# Patient Record
Sex: Female | Born: 1970 | Race: White | Hispanic: No | Marital: Married | Smoking: Former smoker
Health system: Southern US, Community
[De-identification: ages and names within clinical notes are randomized; demographics above are authoritative.]

## PROBLEM LIST (undated history)

## (undated) DIAGNOSIS — D649 Anemia, unspecified: Secondary | ICD-10-CM

## (undated) DIAGNOSIS — Z1371 Encounter for nonprocreative screening for genetic disease carrier status: Secondary | ICD-10-CM

## (undated) DIAGNOSIS — F419 Anxiety disorder, unspecified: Secondary | ICD-10-CM

## (undated) DIAGNOSIS — G35 Multiple sclerosis: Secondary | ICD-10-CM

## (undated) DIAGNOSIS — C50919 Malignant neoplasm of unspecified site of unspecified female breast: Secondary | ICD-10-CM

## (undated) DIAGNOSIS — Z923 Personal history of irradiation: Secondary | ICD-10-CM

## (undated) DIAGNOSIS — G35D Multiple sclerosis, unspecified: Secondary | ICD-10-CM

## (undated) HISTORY — DX: Multiple sclerosis: G35

## (undated) HISTORY — DX: Anxiety disorder, unspecified: F41.9

## (undated) HISTORY — DX: Multiple sclerosis, unspecified: G35.D

## (undated) HISTORY — DX: Anemia, unspecified: D64.9

## (undated) HISTORY — DX: Malignant neoplasm of unspecified site of unspecified female breast: C50.919

---

## 2008-03-01 ENCOUNTER — Encounter: Admission: RE | Admit: 2008-03-01 | Discharge: 2008-03-01 | Payer: Self-pay | Admitting: Family Medicine

## 2008-04-08 ENCOUNTER — Other Ambulatory Visit: Admission: RE | Admit: 2008-04-08 | Discharge: 2008-04-08 | Payer: Self-pay | Admitting: Family Medicine

## 2008-07-08 ENCOUNTER — Encounter: Admission: RE | Admit: 2008-07-08 | Discharge: 2008-07-08 | Payer: Self-pay | Admitting: Family Medicine

## 2008-07-26 HISTORY — PX: LAPAROSCOPIC CHOLECYSTECTOMY: SUR755

## 2010-02-15 DIAGNOSIS — Z923 Personal history of irradiation: Secondary | ICD-10-CM

## 2010-02-15 HISTORY — DX: Personal history of irradiation: Z92.3

## 2010-02-23 ENCOUNTER — Encounter
Admission: RE | Admit: 2010-02-23 | Discharge: 2010-02-23 | Payer: Self-pay | Source: Home / Self Care | Attending: Family Medicine | Admitting: Family Medicine

## 2010-03-04 ENCOUNTER — Encounter
Admission: RE | Admit: 2010-03-04 | Discharge: 2010-03-04 | Payer: Self-pay | Source: Home / Self Care | Attending: Family Medicine | Admitting: Family Medicine

## 2010-03-07 ENCOUNTER — Other Ambulatory Visit: Payer: Self-pay | Admitting: Family Medicine

## 2010-03-07 DIAGNOSIS — R921 Mammographic calcification found on diagnostic imaging of breast: Secondary | ICD-10-CM

## 2010-03-24 ENCOUNTER — Ambulatory Visit
Admission: RE | Admit: 2010-03-24 | Discharge: 2010-03-24 | Disposition: A | Discharge status: Home / Self Care | Payer: BC Managed Care – PPO | Source: Ambulatory Visit | Attending: *Deleted | Admitting: *Deleted

## 2010-03-24 ENCOUNTER — Other Ambulatory Visit: Payer: Self-pay | Admitting: Radiology

## 2010-03-24 ENCOUNTER — Other Ambulatory Visit: Payer: Self-pay | Admitting: Family Medicine

## 2010-03-24 DIAGNOSIS — C50919 Malignant neoplasm of unspecified site of unspecified female breast: Secondary | ICD-10-CM | POA: Insufficient documentation

## 2010-03-24 DIAGNOSIS — R921 Mammographic calcification found on diagnostic imaging of breast: Secondary | ICD-10-CM

## 2010-03-24 HISTORY — DX: Malignant neoplasm of unspecified site of unspecified female breast: C50.919

## 2010-04-03 ENCOUNTER — Other Ambulatory Visit: Payer: Self-pay | Admitting: Surgery

## 2010-04-03 DIAGNOSIS — N6310 Unspecified lump in the right breast, unspecified quadrant: Secondary | ICD-10-CM

## 2010-04-03 HISTORY — PX: BREAST BIOPSY: SHX20

## 2010-04-29 ENCOUNTER — Ambulatory Visit (HOSPITAL_BASED_OUTPATIENT_CLINIC_OR_DEPARTMENT_OTHER)
Admission: RE | Admit: 2010-04-29 | Discharge: 2010-04-29 | Disposition: A | Discharge status: Home / Self Care | Payer: BC Managed Care – PPO | Source: Ambulatory Visit | Attending: Surgery | Admitting: Surgery

## 2010-04-29 ENCOUNTER — Ambulatory Visit
Admission: RE | Admit: 2010-04-29 | Discharge: 2010-04-29 | Disposition: A | Discharge status: Home / Self Care | Payer: BC Managed Care – PPO | Source: Ambulatory Visit | Attending: Surgery | Admitting: Surgery

## 2010-04-29 ENCOUNTER — Other Ambulatory Visit: Payer: Self-pay | Admitting: Surgery

## 2010-04-29 DIAGNOSIS — Z853 Personal history of malignant neoplasm of breast: Secondary | ICD-10-CM | POA: Insufficient documentation

## 2010-04-29 DIAGNOSIS — R928 Other abnormal and inconclusive findings on diagnostic imaging of breast: Secondary | ICD-10-CM | POA: Insufficient documentation

## 2010-04-29 DIAGNOSIS — N6310 Unspecified lump in the right breast, unspecified quadrant: Secondary | ICD-10-CM

## 2010-04-29 DIAGNOSIS — D0590 Unspecified type of carcinoma in situ of unspecified breast: Secondary | ICD-10-CM | POA: Insufficient documentation

## 2010-04-29 HISTORY — PX: BREAST LUMPECTOMY: SHX2

## 2010-05-01 NOTE — Op Note (Signed)
  Traci Watkins, Traci Watkins               ACCOUNT NO.:  [id number]  MEDICAL RECORD NO.:  [id number]           PATIENT TYPE:  LOCATION:                                 FACILITY:  PHYSICIAN:  Currie Paris, M.D.   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  OPERATION:  Needle-guided excision of area of atypical hyperplasia.  SURGEON:  Currie Paris, MD  ANESTHESIA:  General.  CLINICAL HISTORY:  This is a 40 year old lady who has had a previous core biopsy done showing some atypical hyperplasia and a wider excision recommended.  DESCRIPTION OF PROCEDURE:  I saw the patient in the holding area and she had no further questions.  We all initialed the right breast as the operative side.  The patient was taken to the operating room, and after satisfactory general anesthesia the right breast was prepped and draped.  The time- out was done.  I had previously reviewed the films and the guidewire entered laterally, tracked somewhat superiorly, medially, and was in the lower outer quadrant of the right breast.  I made a curvilinear incision about a centimeter medial to the guidewire entry site since that appeared to be where the lesion was going to be and then elevated a little skin flap and manipulated the wire into the wound.  I then freed up the breast tissue circumferentially around the guidewire and grasped the tissue with an Allis clamp and took an excisional biopsy of the tissue around the guidewire taking basically a cylinder tissue out to beyond the tip of the guidewire.  There was what looked like fibrocystic changes in the tissue and some blue dome cysts.  Specimen  mammogram was done and the calcifications appeared to be in the specimen.  The clip was not to be seen.  I went back, irrigated, made sure everything was dry, and carefully palpated my incision and there was a bit of a density in the subcutaneous tissue just under the previous core biopsy skin entry scar and I took that out and included that in a second specimen mammogram and that had contained a clip indicating to me that the clip had migrated out the tract at some point from the time it was placed until today.  Nevertheless, in discussing this with the radiologist, Dr. Rolla Plate, it appeared we had the abnormal calcifications in the main piece of the specimen.  At this point, I made sure everything was dry.  I closed with several layers of 3-0 Vicryl, 4-0 Monocryl subcuticular, and some Dermabond.  The patient tolerated the procedure well, and there were no complications.  Counts were correct.     Currie Paris, M.D.     CJS/MEDQ  D:  04/29/2010  T:  04/30/2010  Job:  [id number]  cc:   Ancil Boozer, MD  Electronically Signed by Cyndia Bent M.D. on 05/01/2010 07:25:20 AM

## 2010-05-08 ENCOUNTER — Other Ambulatory Visit: Payer: Self-pay | Admitting: Surgery

## 2010-05-08 ENCOUNTER — Encounter: Payer: BC Managed Care – PPO | Admitting: Genetic Counselor

## 2010-05-08 DIAGNOSIS — C50911 Malignant neoplasm of unspecified site of right female breast: Secondary | ICD-10-CM

## 2010-05-12 ENCOUNTER — Ambulatory Visit
Admission: RE | Admit: 2010-05-12 | Discharge: 2010-05-12 | Disposition: A | Discharge status: Home / Self Care | Payer: BC Managed Care – PPO | Source: Ambulatory Visit | Attending: Surgery | Admitting: Surgery

## 2010-05-12 DIAGNOSIS — C50911 Malignant neoplasm of unspecified site of right female breast: Secondary | ICD-10-CM

## 2010-05-12 MED ORDER — GADOBENATE DIMEGLUMINE 529 MG/ML IV SOLN
15.0000 mL | Freq: Once | INTRAVENOUS | Status: AC | PRN
  Administered 2010-05-12: 15 mL via INTRAVENOUS

## 2010-05-18 ENCOUNTER — Ambulatory Visit: Payer: BC Managed Care – PPO | Attending: Radiation Oncology | Admitting: Radiation Oncology

## 2010-05-18 DIAGNOSIS — R002 Palpitations: Secondary | ICD-10-CM | POA: Insufficient documentation

## 2010-05-18 DIAGNOSIS — L539 Erythematous condition, unspecified: Secondary | ICD-10-CM | POA: Insufficient documentation

## 2010-05-18 DIAGNOSIS — Z510 Encounter for antineoplastic radiation therapy: Secondary | ICD-10-CM | POA: Insufficient documentation

## 2010-05-18 DIAGNOSIS — C50419 Malignant neoplasm of upper-outer quadrant of unspecified female breast: Secondary | ICD-10-CM | POA: Insufficient documentation

## 2010-05-21 ENCOUNTER — Other Ambulatory Visit: Payer: Self-pay | Admitting: Surgery

## 2010-05-21 DIAGNOSIS — Z9889 Other specified postprocedural states: Secondary | ICD-10-CM

## 2010-05-26 ENCOUNTER — Ambulatory Visit
Admission: RE | Admit: 2010-05-26 | Discharge: 2010-05-26 | Disposition: A | Discharge status: Home / Self Care | Payer: BC Managed Care – PPO | Source: Ambulatory Visit | Attending: Surgery | Admitting: Surgery

## 2010-05-26 DIAGNOSIS — Z9889 Other specified postprocedural states: Secondary | ICD-10-CM

## 2010-06-08 ENCOUNTER — Other Ambulatory Visit: Payer: Self-pay | Admitting: Family Medicine

## 2010-06-08 DIAGNOSIS — R921 Mammographic calcification found on diagnostic imaging of breast: Secondary | ICD-10-CM

## 2010-07-09 ENCOUNTER — Other Ambulatory Visit: Payer: Self-pay | Admitting: Radiation Oncology

## 2010-07-09 LAB — CBC WITH DIFFERENTIAL/PLATELET
Basophils Absolute: 0 10*3/uL (ref 0.0–0.1)
EOS%: 0.7 % (ref 0.0–7.0)
Eosinophils Absolute: 0 10*3/uL (ref 0.0–0.5)
HCT: 40.5 % (ref 34.8–46.6)
HGB: 14.2 g/dL (ref 11.6–15.9)
MCH: 30.2 pg (ref 25.1–34.0)
MCV: 86.6 fL (ref 79.5–101.0)
MONO%: 12.6 % (ref 0.0–14.0)
NEUT#: 3.7 10*3/uL (ref 1.5–6.5)
NEUT%: 68.7 % (ref 38.4–76.8)
lymph#: 0.9 10*3/uL (ref 0.9–3.3)

## 2010-07-09 LAB — BASIC METABOLIC PANEL
BUN: 9 mg/dL (ref 6–23)
Chloride: 100 mEq/L (ref 96–112)
Creatinine, Ser: 0.68 mg/dL (ref 0.40–1.20)
Glucose, Bld: 100 mg/dL — ABNORMAL HIGH (ref 70–99)

## 2010-08-10 ENCOUNTER — Other Ambulatory Visit: Payer: Self-pay | Admitting: Oncology

## 2010-08-10 ENCOUNTER — Encounter (HOSPITAL_BASED_OUTPATIENT_CLINIC_OR_DEPARTMENT_OTHER): Payer: BC Managed Care – PPO | Admitting: Oncology

## 2010-08-10 DIAGNOSIS — C50419 Malignant neoplasm of upper-outer quadrant of unspecified female breast: Secondary | ICD-10-CM

## 2010-08-10 LAB — CBC WITH DIFFERENTIAL/PLATELET
BASO%: 0.5 % (ref 0.0–2.0)
EOS%: 0.4 % (ref 0.0–7.0)
Eosinophils Absolute: 0 10*3/uL (ref 0.0–0.5)
LYMPH%: 18 % (ref 14.0–49.7)
MCHC: 34.5 g/dL (ref 31.5–36.0)
MCV: 87.1 fL (ref 79.5–101.0)
MONO%: 11.6 % (ref 0.0–14.0)
NEUT#: 4.3 10*3/uL (ref 1.5–6.5)
Platelets: 289 10*3/uL (ref 145–400)
RBC: 4.5 10*6/uL (ref 3.70–5.45)
RDW: 12.6 % (ref 11.2–14.5)

## 2010-08-10 LAB — COMPREHENSIVE METABOLIC PANEL
ALT: 28 U/L (ref 0–35)
AST: 21 U/L (ref 0–37)
Albumin: 3.9 g/dL (ref 3.5–5.2)
Alkaline Phosphatase: 33 U/L — ABNORMAL LOW (ref 39–117)
Glucose, Bld: 100 mg/dL — ABNORMAL HIGH (ref 70–99)
Potassium: 4 mEq/L (ref 3.5–5.3)
Sodium: 136 mEq/L (ref 135–145)
Total Bilirubin: 0.4 mg/dL (ref 0.3–1.2)
Total Protein: 7.2 g/dL (ref 6.0–8.3)

## 2010-08-20 ENCOUNTER — Ambulatory Visit
Admission: RE | Admit: 2010-08-20 | Discharge: 2010-08-20 | Disposition: A | Discharge status: Home / Self Care | Payer: BC Managed Care – PPO | Source: Ambulatory Visit | Attending: Radiation Oncology | Admitting: Radiation Oncology

## 2010-09-30 ENCOUNTER — Encounter (INDEPENDENT_AMBULATORY_CARE_PROVIDER_SITE_OTHER): Payer: Self-pay | Admitting: Surgery

## 2010-10-08 ENCOUNTER — Ambulatory Visit (INDEPENDENT_AMBULATORY_CARE_PROVIDER_SITE_OTHER): Payer: BC Managed Care – PPO | Admitting: Surgery

## 2010-10-08 ENCOUNTER — Encounter (INDEPENDENT_AMBULATORY_CARE_PROVIDER_SITE_OTHER): Payer: Self-pay | Admitting: Surgery

## 2010-10-08 VITALS — BP 110/62 | HR 70

## 2010-10-08 DIAGNOSIS — Z853 Personal history of malignant neoplasm of breast: Secondary | ICD-10-CM

## 2010-10-08 NOTE — Patient Instructions (Signed)
Have bilateral mammograms in November and see me in March

## 2010-10-08 NOTE — Progress Notes (Signed)
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 She has no problems or concerns on either side.  PFSH She has had no significant changes since the last visit here.  ROS There have been no significant changes since the last visit here  General: The patient is alert, oriented, generally healty appearing, NAD. Mood and affect are normal.  Breasts: Right breast shows mild radiation changes. Slight ridging at lumpectomy site, other than that no concerns. Left breast normal Lymphatics: She has no axillary or supraclavicular adenopathy on either side.  Extremities: Full ROM of the surgical side with no lymphedema noted.  Data Reviewed: Notes from rad onc.  Impression: Doing well, with no evidence of recurrent cancer or new cancer  Plan: Will continue to follow up on an annual basis here next visit in March 2013. Mammogram in Nov/Dec time frame

## 2010-11-16 ENCOUNTER — Encounter (HOSPITAL_BASED_OUTPATIENT_CLINIC_OR_DEPARTMENT_OTHER): Payer: BC Managed Care – PPO | Admitting: Oncology

## 2010-11-16 DIAGNOSIS — C50419 Malignant neoplasm of upper-outer quadrant of unspecified female breast: Secondary | ICD-10-CM

## 2010-12-24 ENCOUNTER — Telehealth (INDEPENDENT_AMBULATORY_CARE_PROVIDER_SITE_OTHER): Payer: Self-pay | Admitting: General Surgery

## 2010-12-24 NOTE — Telephone Encounter (Signed)
Patient due for mammogram in January 2013. We put in order for mammogram in November 2012. Is there a reason to do this early? Please advise.

## 2010-12-29 NOTE — Telephone Encounter (Signed)
Per DR Jamey Ripa ok to wait. I called Kacie back at [facility] and per Lannette Donath she does not need an answer, she figured it out.

## 2010-12-31 ENCOUNTER — Other Ambulatory Visit (INDEPENDENT_AMBULATORY_CARE_PROVIDER_SITE_OTHER): Payer: Self-pay | Admitting: Surgery

## 2010-12-31 ENCOUNTER — Ambulatory Visit
Admission: RE | Admit: 2010-12-31 | Discharge: 2010-12-31 | Disposition: A | Discharge status: Home / Self Care | Payer: BC Managed Care – PPO | Source: Ambulatory Visit | Attending: Surgery | Admitting: Surgery

## 2010-12-31 DIAGNOSIS — Z853 Personal history of malignant neoplasm of breast: Secondary | ICD-10-CM

## 2011-01-19 ENCOUNTER — Ambulatory Visit
Admission: RE | Admit: 2011-01-19 | Discharge: 2011-01-19 | Disposition: A | Discharge status: Home / Self Care | Payer: BC Managed Care – PPO | Source: Ambulatory Visit | Attending: Family Medicine | Admitting: Family Medicine

## 2011-01-19 ENCOUNTER — Other Ambulatory Visit: Payer: Self-pay | Admitting: Family Medicine

## 2011-01-19 DIAGNOSIS — M25559 Pain in unspecified hip: Secondary | ICD-10-CM

## 2011-02-08 ENCOUNTER — Encounter: Payer: Self-pay | Admitting: *Deleted

## 2011-02-08 DIAGNOSIS — G35A Relapsing-remitting multiple sclerosis: Secondary | ICD-10-CM | POA: Insufficient documentation

## 2011-02-08 DIAGNOSIS — G35 Multiple sclerosis: Secondary | ICD-10-CM | POA: Insufficient documentation

## 2011-02-08 NOTE — Progress Notes (Signed)
Follow Up Right Breast Cancer DCIS  Radiation therapy=06/08/10-07/22/10  On Tamoxifen 10mg  daily   NKda

## 2011-02-18 ENCOUNTER — Ambulatory Visit
Admission: RE | Admit: 2011-02-18 | Discharge: 2011-02-18 | Disposition: A | Discharge status: Home / Self Care | Payer: BC Managed Care – PPO | Source: Ambulatory Visit | Attending: Radiation Oncology | Admitting: Radiation Oncology

## 2011-02-18 ENCOUNTER — Encounter: Payer: Self-pay | Admitting: Radiation Oncology

## 2011-02-18 DIAGNOSIS — C50919 Malignant neoplasm of unspecified site of unspecified female breast: Secondary | ICD-10-CM

## 2011-02-18 NOTE — Progress Notes (Signed)
Follow up for Right  Breasts ca, radiation therapy txs=06/08/10-6+/6/12 On Tamoxifen daily, ;last mammogram 12/31/10, taking meloxicam 7.5mg  daily left hip pain, 01/19/11 xray neg. Right breast tender at times, no itching, eating and drinking well,   2:38 PM

## 2011-02-18 NOTE — Progress Notes (Signed)
   Department of Radiation Oncology  Phone:  [phone] Fax:        [phone]   CC: Traci Watkins, M.D.  Traci Watkins, M.D.   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  INTERVAL SINCE LAST RADIATION:  6 months.  NARRATIVE:  Traci Watkins returns today for routine followup assessment.  She is without complaint.  She sees Traci Watkins and has been receiving tamoxifen with no complaints. She notes occasional discomfort of the treated right breast but no persistent pain. She had followup mammogram in November which was normal.  PHYSICAL EXAMINATION:  General:  The patient is in no acute stress today.  She is alert and oriented.   Examination of the treated breast reveals some slightly darker pigmentation . The right breast has a slight left in a seated position. In a supine position with palpation, the right breast is soft. There is a soft tissue defect at the lumpectomy site and there are some tenderness with postoperative changes in this area. There is no dominant mass anywhere within the right breast. The right axilla is free of adenopathy lungs are clear to auscultation heart is regular.  IMPRESSION:  Traci Watkins has recovered from the acute effects of radiotherapy to the breast she has a good cosmetic result following breast conservation. She has no evidence of recurrence  PLAN:  The patient come to Radiation Oncology Clinic on an as-needed basis.  ------------------------------------------------  Traci Watkins, M.D.

## 2011-03-22 ENCOUNTER — Telehealth: Payer: Self-pay | Admitting: *Deleted

## 2011-03-22 NOTE — Telephone Encounter (Signed)
left patient informing patient of the new date and time in April 2013

## 2011-05-28 ENCOUNTER — Other Ambulatory Visit: Payer: Self-pay | Admitting: *Deleted

## 2011-05-28 DIAGNOSIS — C50919 Malignant neoplasm of unspecified site of unspecified female breast: Secondary | ICD-10-CM

## 2011-05-31 ENCOUNTER — Telehealth: Payer: Self-pay | Admitting: Oncology

## 2011-05-31 ENCOUNTER — Ambulatory Visit (HOSPITAL_BASED_OUTPATIENT_CLINIC_OR_DEPARTMENT_OTHER): Payer: BC Managed Care – PPO | Admitting: Physician Assistant

## 2011-05-31 ENCOUNTER — Encounter: Payer: Self-pay | Admitting: Physician Assistant

## 2011-05-31 ENCOUNTER — Other Ambulatory Visit (HOSPITAL_BASED_OUTPATIENT_CLINIC_OR_DEPARTMENT_OTHER): Payer: BC Managed Care – PPO

## 2011-05-31 VITALS — BP 118/75 | HR 58 | Temp 98.2°F | Ht 67.5 in | Wt 185.8 lb

## 2011-05-31 DIAGNOSIS — Z853 Personal history of malignant neoplasm of breast: Secondary | ICD-10-CM

## 2011-05-31 DIAGNOSIS — C50919 Malignant neoplasm of unspecified site of unspecified female breast: Secondary | ICD-10-CM

## 2011-05-31 DIAGNOSIS — C50419 Malignant neoplasm of upper-outer quadrant of unspecified female breast: Secondary | ICD-10-CM

## 2011-05-31 LAB — COMPREHENSIVE METABOLIC PANEL WITH GFR
ALT: 13 U/L (ref 0–35)
AST: 12 U/L (ref 0–37)
Albumin: 3.8 g/dL (ref 3.5–5.2)
Alkaline Phosphatase: 25 U/L — ABNORMAL LOW (ref 39–117)
BUN: 12 mg/dL (ref 6–23)
CO2: 24 meq/L (ref 19–32)
Calcium: 8.8 mg/dL (ref 8.4–10.5)
Chloride: 105 meq/L (ref 96–112)
Creatinine, Ser: 0.71 mg/dL (ref 0.50–1.10)
Glucose, Bld: 96 mg/dL (ref 70–99)
Potassium: 4 meq/L (ref 3.5–5.3)
Sodium: 139 meq/L (ref 135–145)
Total Bilirubin: 0.4 mg/dL (ref 0.3–1.2)
Total Protein: 6.3 g/dL (ref 6.0–8.3)

## 2011-05-31 LAB — CBC WITH DIFFERENTIAL/PLATELET
BASO%: 0.8 % (ref 0.0–2.0)
Basophils Absolute: 0 10e3/uL (ref 0.0–0.1)
EOS%: 0.8 % (ref 0.0–7.0)
Eosinophils Absolute: 0 10e3/uL (ref 0.0–0.5)
HCT: 37.8 % (ref 34.8–46.6)
HGB: 13 g/dL (ref 11.6–15.9)
LYMPH%: 23.5 % (ref 14.0–49.7)
MCH: 29.9 pg (ref 25.1–34.0)
MCHC: 34.3 g/dL (ref 31.5–36.0)
MCV: 87.1 fL (ref 79.5–101.0)
MONO#: 0.4 10e3/uL (ref 0.1–0.9)
MONO%: 7.4 % (ref 0.0–14.0)
NEUT#: 4 10e3/uL (ref 1.5–6.5)
NEUT%: 67.5 % (ref 38.4–76.8)
Platelets: 242 10e3/uL (ref 145–400)
RBC: 4.34 10e6/uL (ref 3.70–5.45)
RDW: 12.7 % (ref 11.2–14.5)
WBC: 5.9 10e3/uL (ref 3.9–10.3)
lymph#: 1.4 10e3/uL (ref 0.9–3.3)
nRBC: 0 % (ref 0–0)

## 2011-05-31 MED ORDER — TAMOXIFEN CITRATE 20 MG PO TABS: 20.0000 mg | ORAL_TABLET | Freq: Every day | ORAL | Status: DC

## 2011-05-31 NOTE — Telephone Encounter (Signed)
gve the pt her feb 2014 appt calendar along with the mammo appt for November.

## 2011-05-31 NOTE — Progress Notes (Signed)
ID: Traci Watkins   DOB: 09-02-1970  MR#: [id number]  WUJ#:[id number]  HISTORY OF PRESENT ILLNESS: .  Traci Watkins had her first mammogram ever January 2012.  This screening study showed a possible mass in the right breast.  She was recalled for additional views and diagnostic mammography on January 18 showed a small cluster of microcalcifications laterally and inferior in the right breast.  They were considered the slightly suspicious.  On physical exam there were small palpable mobile masses in the right breast at the 12 o'clock position.  However, ultrasound showed these to be simple cysts.   On February 7 Dr. Jean Rosenthal proceeded to biopsy of the area of abnormal calcification and this showed ([id number]) atypical ductal hyperplasia in a papillary lesion.  The patient was then referred to Dr. Jamey Ripa, and after appropriate discussion on March 14 the patient underwent needle-guided excision of the area in question.  This showed ([id number]) ductal carcinoma in situ, low grade, spanning approximately 0.5 cm, focally within 1 mm of the posterior margin.  The tumor was estrogen receptor positive at 94% and progesterone receptor positive at 100%.   Dr. Jamey Ripa and Dr. Kathrynn Running discussed the margin and issue and their feeling was that the patient's risk of recurrence was sufficiently low with the addition of radiation that the margin clearance would not make a significant contribution.  The patient then proceeded to radiation under Dr. Kathrynn Running and this was completed June 6.   She began on tamoxifen in September 2012, 20 mg daily with good tolerance.  INTERVAL HISTORY: The patient returns today for a routine six-month followup of her right breast carcinoma. She continues on tamoxifen which she is tolerating very well. Her family is doing well. Interval history is unremarkable.   REVIEW OF SYSTEMS: Physically, Traci Watkins had no new complaints. She is having no problems with hot flashes. She's still having her periods,  although they are heavier and a little more irregular than they have been in the past. Last menstrual cycle was 05/15/2011. No vaginal discharge or dryness. No peripheral swelling. No evidence of abnormal clotting. No change in vision.  She also denies any recent illnesses and has had no fevers, chills, or night sweats. No nausea or change in bowel habits. No cough or shortness of breath. No abnormal headaches. She does have some pain occasionally in her left hip which has been evaluated by her primary care physician and she is on an anti-inflammatory medication.  A detailed review of systems is otherwise noncontributory.  PAST MEDICAL HISTORY: Past Medical History  Diagnosis Date  . MS (multiple sclerosis)   . Breast cancer 03/24/10    /04/29/10 lumpectomt dcis  . Multiple sclerosis     10 years,tx with interferon  . Anemia     during pregnancy  . Anxiety     PAST SURGICAL HISTORY: Past Surgical History  Procedure Date  . Laparoscopic cholecystectomy 07/26/08  . Breast lumpectomy 04/29/10    right    FAMILY HISTORY Family History  Problem Relation Age of Onset  . Cancer Father     bladder  . Aneurysm Father    GYN HISTORY: Menarche age 44, first pregnancy to term at age 46.  The patient is Gx P1.   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  She has been married for 20 years to Traci Watkins who used to work in Human resources officer..  Their daughter, 52 years old, attends [employer].  She attends a [address]  church although the patient herself is not a Advice worker.   ADVANCED DIRECTIVES:  HEALTH MAINTENANCE: History  Substance Use Topics  . Smoking status: Former Smoker -- 0.5 packs/day    Types: Cigarettes  . Smokeless tobacco: Not on file  . Alcohol Use: Yes     socially     Colonoscopy:  PAP:  Bone density:  Lipid panel:  No Known Allergies  Current Outpatient Prescriptions  Medication Sig Dispense Refill  . fish  oil-omega-3 fatty acids 1000 MG capsule Take 2 g by mouth daily.      . meloxicam (MOBIC) 7.5 MG tablet Take 7.5 mg by mouth daily. For left hip painPA,  Yehuda Mao at [facility]       . Multiple Vitamin (MULTIVITAMIN) tablet Take 1 tablet by mouth daily.      . tamoxifen (NOLVADEX) 20 MG tablet Take 1 tablet (20 mg total) by mouth daily.  90 tablet  3  . acetaminophen (TYLENOL) 160 MG tablet Take 160 mg by mouth as needed.          OBJECTIVE: Filed Vitals:   05/31/11 0950  BP: 118/75  Pulse: 58  Temp: 98.2 F (36.8 C)     Body mass index is 28.67 kg/(m^2).    ECOG FS: 0  Physical Exam: HEENT:  Sclerae anicteric, conjunctivae pink.  Oropharynx clear.  No mucositis or candidiasis.   Nodes:  No cervical, supraclavicular, or axillary lymphadenopathy palpated.  Breast Exam:  Right breast is status post lumpectomy, mildly tender to palpation. No suspicious nodularity or skin changes. No evidence of local recurrence. Left breast is benign, with no masses, skin changes, or nipple inversion.  Lungs:  Clear to auscultation bilaterally.  No crackles, rhonchi, or wheezes.   Heart:  Regular rate and rhythm.   Abdomen:  Soft, nontender.  Positive bowel sounds.  No organomegaly or masses palpated.   Musculoskeletal:  No focal spinal tenderness to palpation.  Extremities:  Benign.  No peripheral edema or cyanosis.   Skin:  Benign.   Neuro:  Nonfocal. Alert and oriented x3.    LAB RESULTS: Lab Results  Component Value Date   WBC 5.9 05/31/2011   NEUTROABS 4.0 05/31/2011   HGB 13.0 05/31/2011   HCT 37.8 05/31/2011   MCV 87.1 05/31/2011   PLT 242 05/31/2011      Chemistry      Component Value Date/Time   NA 136 08/10/2010 1508   K 4.0 08/10/2010 1508   CL 101 08/10/2010 1508   CO2 27 08/10/2010 1508   BUN 14 08/10/2010 1508   CREATININE 0.65 08/10/2010 1508      Component Value Date/Time   CALCIUM 9.6 08/10/2010 1508   ALKPHOS 33* 08/10/2010 1508   AST 21 08/10/2010 1508   ALT 28 08/10/2010 1508    BILITOT 0.4 08/10/2010 1508       STUDIES: This recent bilateral mammogram was at the [facility] Center in November 2012.  ASSESSMENT: A 41 year old [place] woman originally from [place]   (1)  status post right lumpectomy March 2012 for ductal carcinoma in situ, low grade measuring 5 mm with some evidence of necrosis and a focally close but technically negative posterior margin.  The tumor was strongly estrogen and progesterone receptor positive.    (2)  She completed radiation in June 2012.   (3)   she began on tamoxifen, 20 mg daily, in September 2012.  (4)  She is BRCA-1 and 2 negative.    (5)  Multiple  Sclerosis, diagnosed in 2000  PLAN: With regards to her breast cancer, Rozina is doing quite well, and there is no clinical evidence of disease recurrence at this time. She'll continue on her tamoxifen which I have refilled for her today. Per Dr. Darrall Dears previous plan, he will see her again in February, and we will then begin annual followups until she completes 5 years on her tamoxifen.  All this was reviewed with Lelon Mast today, and she voices understanding and agreement with this plan. She will call with any changes or problems.  Jeniyah Menor    05/31/2011

## 2011-07-08 ENCOUNTER — Ambulatory Visit (INDEPENDENT_AMBULATORY_CARE_PROVIDER_SITE_OTHER): Payer: BC Managed Care – PPO | Admitting: Surgery

## 2011-07-08 ENCOUNTER — Encounter (INDEPENDENT_AMBULATORY_CARE_PROVIDER_SITE_OTHER): Payer: Self-pay | Admitting: Surgery

## 2011-07-08 VITALS — BP 120/84 | HR 64 | Temp 98.0°F | Resp 14 | Ht 68.0 in | Wt 184.4 lb

## 2011-07-08 DIAGNOSIS — Z853 Personal history of malignant neoplasm of breast: Secondary | ICD-10-CM

## 2011-07-08 NOTE — Progress Notes (Signed)
NAME: Traci Watkins       DOB: September 27, 1970           DATE: 07/08/2011       MRN: [id number]   Traci Watkins is a 41 y.o.Marland Kitchenfemale who presents for routine followup of her Right breast DCIS diagnosed in 2012 and treated with lumpectomy. She has no problems or concerns on either side.  PFSH: She has had no significant changes since the last visit here.  ROS: There have been no significant changes since the last visit here  EXAM: General: The patient is alert, oriented, generally healty appearing, NAD. Mood and affect are normal.  Breasts:  Right shows a little loss of tissue at the lumpectomy site, otherwise wnl. Left is wnl  Lymphatics: She has no axillary or supraclavicular adenopathy on either side.  Extremities: Full ROM of the surgical side with no lymphedema noted.  Data Reviewed: Mammogram last year negative except for a cyst  Impression: Doing well, with no evidence of recurrent cancer or new cancer  Plan: Will continue to follow up on an annual basis here.

## 2012-01-03 ENCOUNTER — Ambulatory Visit
Admission: RE | Admit: 2012-01-03 | Discharge: 2012-01-03 | Disposition: A | Discharge status: Home / Self Care | Payer: BC Managed Care – PPO | Source: Ambulatory Visit | Attending: Physician Assistant | Admitting: Physician Assistant

## 2012-01-03 DIAGNOSIS — Z853 Personal history of malignant neoplasm of breast: Secondary | ICD-10-CM

## 2012-03-27 ENCOUNTER — Other Ambulatory Visit (HOSPITAL_COMMUNITY)
Admission: RE | Admit: 2012-03-27 | Discharge: 2012-03-27 | Disposition: A | Discharge status: Home / Self Care | Payer: BC Managed Care – PPO | Source: Ambulatory Visit | Attending: Family Medicine | Admitting: Family Medicine

## 2012-03-27 ENCOUNTER — Other Ambulatory Visit: Payer: Self-pay | Admitting: Family Medicine

## 2012-03-27 DIAGNOSIS — Z1151 Encounter for screening for human papillomavirus (HPV): Secondary | ICD-10-CM | POA: Insufficient documentation

## 2012-03-27 DIAGNOSIS — Z124 Encounter for screening for malignant neoplasm of cervix: Secondary | ICD-10-CM | POA: Insufficient documentation

## 2012-03-27 DIAGNOSIS — Z113 Encounter for screening for infections with a predominantly sexual mode of transmission: Secondary | ICD-10-CM | POA: Insufficient documentation

## 2012-04-03 ENCOUNTER — Other Ambulatory Visit: Payer: Self-pay | Admitting: Oncology

## 2012-04-03 ENCOUNTER — Telehealth: Payer: Self-pay | Admitting: Oncology

## 2012-04-03 ENCOUNTER — Other Ambulatory Visit (HOSPITAL_BASED_OUTPATIENT_CLINIC_OR_DEPARTMENT_OTHER): Payer: BC Managed Care – PPO | Admitting: Lab

## 2012-04-03 ENCOUNTER — Ambulatory Visit (HOSPITAL_BASED_OUTPATIENT_CLINIC_OR_DEPARTMENT_OTHER): Payer: BC Managed Care – PPO | Admitting: Oncology

## 2012-04-03 VITALS — BP 138/85 | HR 68 | Temp 98.9°F | Resp 20 | Ht 68.0 in | Wt 193.6 lb

## 2012-04-03 DIAGNOSIS — Z853 Personal history of malignant neoplasm of breast: Secondary | ICD-10-CM

## 2012-04-03 DIAGNOSIS — D0590 Unspecified type of carcinoma in situ of unspecified breast: Secondary | ICD-10-CM

## 2012-04-03 LAB — CANCER ANTIGEN 27.29: CA 27.29: 14 U/mL (ref 0–39)

## 2012-04-03 LAB — CBC WITH DIFFERENTIAL/PLATELET
Basophils Absolute: 0 10*3/uL (ref 0.0–0.1)
EOS%: 0.5 % (ref 0.0–7.0)
HCT: 40.5 % (ref 34.8–46.6)
HGB: 13.7 g/dL (ref 11.6–15.9)
MCH: 30.2 pg (ref 25.1–34.0)
MONO#: 0.6 10*3/uL (ref 0.1–0.9)
NEUT%: 69.1 % (ref 38.4–76.8)
Platelets: 270 10*3/uL (ref 145–400)
lymph#: 1.3 10*3/uL (ref 0.9–3.3)

## 2012-04-03 LAB — COMPREHENSIVE METABOLIC PANEL (CC13)
ALT: 20 U/L (ref 0–55)
Alkaline Phosphatase: 28 U/L — ABNORMAL LOW (ref 40–150)
Creatinine: 0.8 mg/dL (ref 0.6–1.1)
Glucose: 98 mg/dl (ref 70–99)
Sodium: 139 mEq/L (ref 136–145)
Total Bilirubin: 0.49 mg/dL (ref 0.20–1.20)
Total Protein: 7.1 g/dL (ref 6.4–8.3)

## 2012-04-03 NOTE — Progress Notes (Signed)
ID: Billey Chang   DOB: 11-07-1970  MR#: [id number]  WUJ#:[id number]  PCP: Carilyn Goodpasture, PA/ GYN: SU: Cicero Duck OTHER MD: Despina Arias   HISTORY OF PRESENT ILLNESS:  Zaeda had her first mammogram ever January 2012.  This screening study showed a possible mass in the right breast.  She was recalled for additional views and diagnostic mammography on January 18 showed a small cluster of microcalcifications laterally and inferior in the right breast.  They were considered the slightly suspicious.  On physical exam there were small palpable mobile masses in the right breast at the 12 o'clock position.  However, ultrasound showed these to be simple cysts.   On February 7 Dr. Jean Rosenthal proceeded to biopsy of the area of abnormal calcification and this showed ([id number]) atypical ductal hyperplasia in a papillary lesion.  The patient was then referred to Dr. Jamey Ripa, and after appropriate discussion on March 14 the patient underwent needle-guided excision of the area in question.  This showed ([id number]) ductal carcinoma in situ, low grade, spanning approximately 0.5 cm, focally within 1 mm of the posterior margin.  The tumor was estrogen receptor positive at 94% and progesterone receptor positive at 100%.   Dr. Jamey Ripa and Dr. Kathrynn Running discussed the margin and issue and their feeling was that the patient's risk of recurrence was sufficiently low with the addition of radiation that the margin clearance would not make a significant contribution.  The patient then proceeded to radiation under Dr. Kathrynn Running and this was completed June 6.   She began on tamoxifen in September 2012, 20 mg daily with good tolerance.  INTERVAL HISTORY: The patient returns today for followup of her breast cancer. The interval history is generally unremarkable from a medical point of view. Her multiple sclerosis is well-controlled and she is tolerating the tamoxifen with no side effects whatsoever that she is aware of. From a  social point of view she separated from her husband in October. "That's going okay".   REVIEW OF SYSTEMS: She is having no hot flashes, no vaginal wetness or dryness, and indeed is still having periods although at little bit more irregular than before. There now about every 37 days or so instead of every 28 days. She has had no unusual weakness, weight loss, fever, rash, bleeding, or other systemic symptoms of concern. She is working full-time. A detailed review of systems was otherwise noncontributory  PAST MEDICAL HISTORY: Past Medical History  Diagnosis Date  . MS (multiple sclerosis)   . Breast cancer 03/24/10    /04/29/10 lumpectomt dcis  . Multiple sclerosis     10 years,tx with interferon  . Anemia     during pregnancy  . Anxiety     PAST SURGICAL HISTORY: Past Surgical History  Procedure Laterality Date  . Laparoscopic cholecystectomy  07/26/08  . Breast lumpectomy  04/29/10    right    FAMILY HISTORY Family History  Problem Relation Age of Onset  . Cancer Father     bladder  . Aneurysm Father    GYN HISTORY: Menarche age 42, first pregnancy to term at age 13.  The patient is Gx P1.   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  She was married for 44 years but separated from her husband October of 2013. At home is just she and 2 dogs. Her daughter attends [employer].  The patient is not a church attender.   ADVANCED DIRECTIVES: Not in place  HEALTH MAINTENANCE: History  Substance Use Topics  . Smoking status: Former Smoker -- 0.50 packs/day    Types: Cigarettes  . Smokeless tobacco: Never Used  . Alcohol Use: Yes     Comment: socially     Colonoscopy:  PAP:  Bone density:  Lipid panel:  No Known Allergies  Current Outpatient Prescriptions  Medication Sig Dispense Refill  . acetaminophen (TYLENOL) 160 MG tablet Take 160 mg by mouth as needed.        . fish oil-omega-3 fatty acids 1000 MG capsule Take 2 g by  mouth daily.      . meloxicam (MOBIC) 7.5 MG tablet Take 7.5 mg by mouth daily. For left hip painPA,  Yehuda Mao at [facility]       . Multiple Vitamin (MULTIVITAMIN) tablet Take 1 tablet by mouth daily.      . tamoxifen (NOLVADEX) 20 MG tablet Take 1 tablet (20 mg total) by mouth daily.  90 tablet  3   No current facility-administered medications for this visit.    OBJECTIVE: Young white woman in no acute distress Filed Vitals:   04/03/12 0925  BP: 138/85  Pulse: 68  Temp: 98.9 F (37.2 C)  Resp: 20     Body mass index is 29.44 kg/(m^2).    ECOG FS: 0  Sclerae unicteric Oropharynx clear No cervical or supraclavicular adenopathy Lungs no rales or rhonchi Heart regular rate and rhythm Abd benign MSK no focal spinal tenderness, no peripheral edema Neuro: nonfocal; well oriented; positive affect Breasts: The right breast is status post lumpectomy and radiation. There is no evidence of local recurrence. The right axilla is benign. The left breast is unremarkable     LAB RESULTS: Lab Results  Component Value Date   WBC 6.3 04/03/2012   NEUTROABS 4.4 04/03/2012   HGB 13.7 04/03/2012   HCT 40.5 04/03/2012   MCV 89.2 04/03/2012   PLT 270 04/03/2012      Chemistry      Component Value Date/Time   NA 139 05/31/2011 0912   K 4.0 05/31/2011 0912   CL 105 05/31/2011 0912   CO2 24 05/31/2011 0912   BUN 12 05/31/2011 0912   CREATININE 0.71 05/31/2011 0912      Component Value Date/Time   CALCIUM 8.8 05/31/2011 0912   ALKPHOS 25* 05/31/2011 0912   AST 12 05/31/2011 0912   ALT 13 05/31/2011 0912   BILITOT 0.4 05/31/2011 0912       STUDIES: Mammography November 2013 was unremarkable  ASSESSMENT: A 42 year old BRCA negative [place] woman 42 originally from [place]   (1)  status post right lumpectomy March 2012 for ductal carcinoma in situ, low grade measuring 5 mm with some evidence of necrosis and a focally close but technically negative posterior margin.  The tumor was strongly  estrogen and progesterone receptor positive.    (2)  She completed radiation in June 2012.   (3)   she began on tamoxifen in September 2012.  (4)  Multiple Sclerosis, diagnosed in 2000  PLAN: Kimmie is doing great from a breast cancer point of view. The plan is to continue tamoxifen for total of 5 years. She has a good understanding of the fact that her breast cancer it is not life-threatening and indeed we are considering changing the designation of ductal carcinoma in situ from "carcinoma" to "an atypical proliferation of breast cells".  She will see [place] again in one year. She knows to call for problems that may develop before that visit.  MAGRINAT,GUSTAV C  04/03/2012   

## 2012-04-07 IMAGING — CR DG HIP (WITH OR WITHOUT PELVIS) 2-3V*L*
2 series · 2 of 2 positions shown · non-contrast
Comparison: None.

CLINICAL DATA: Left hip pain, history of breast carcinoma, no
injury

LEFT HIP - COMPLETE 2+ VIEW

[view not recorded (1 of 2)]
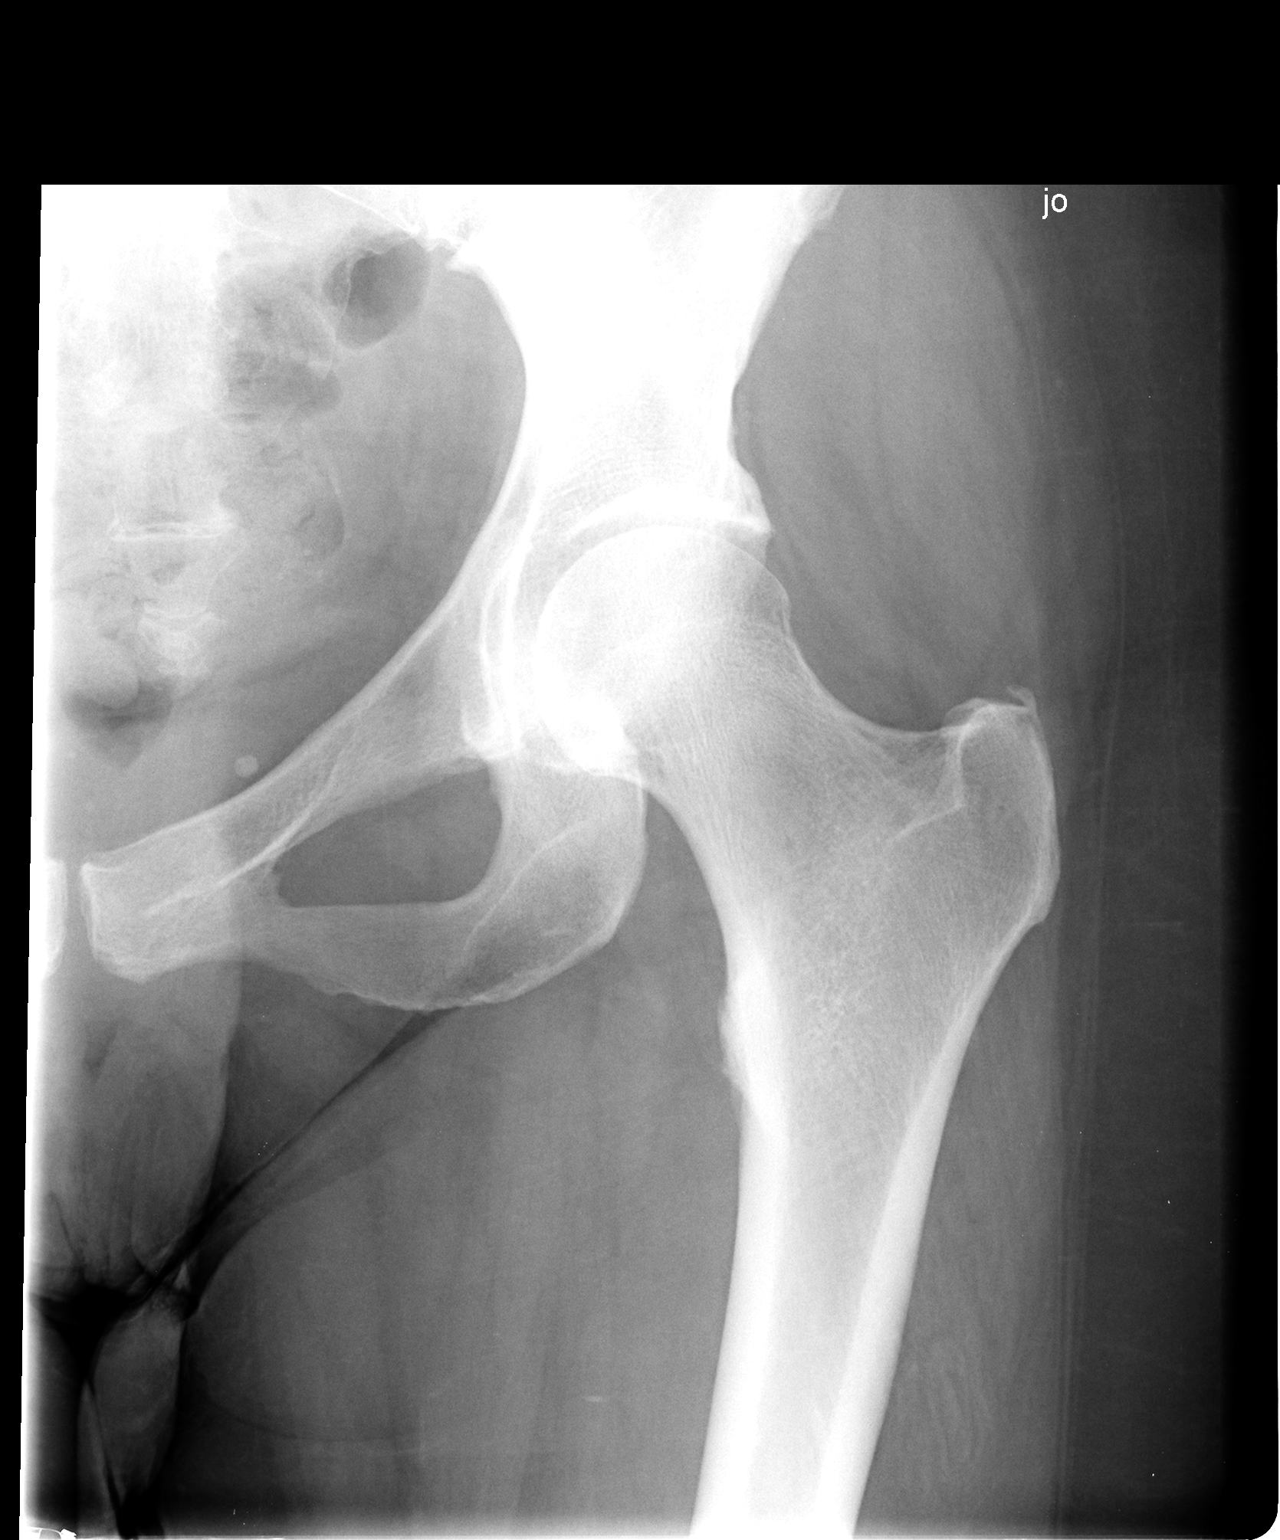

[view not recorded (2 of 2)]
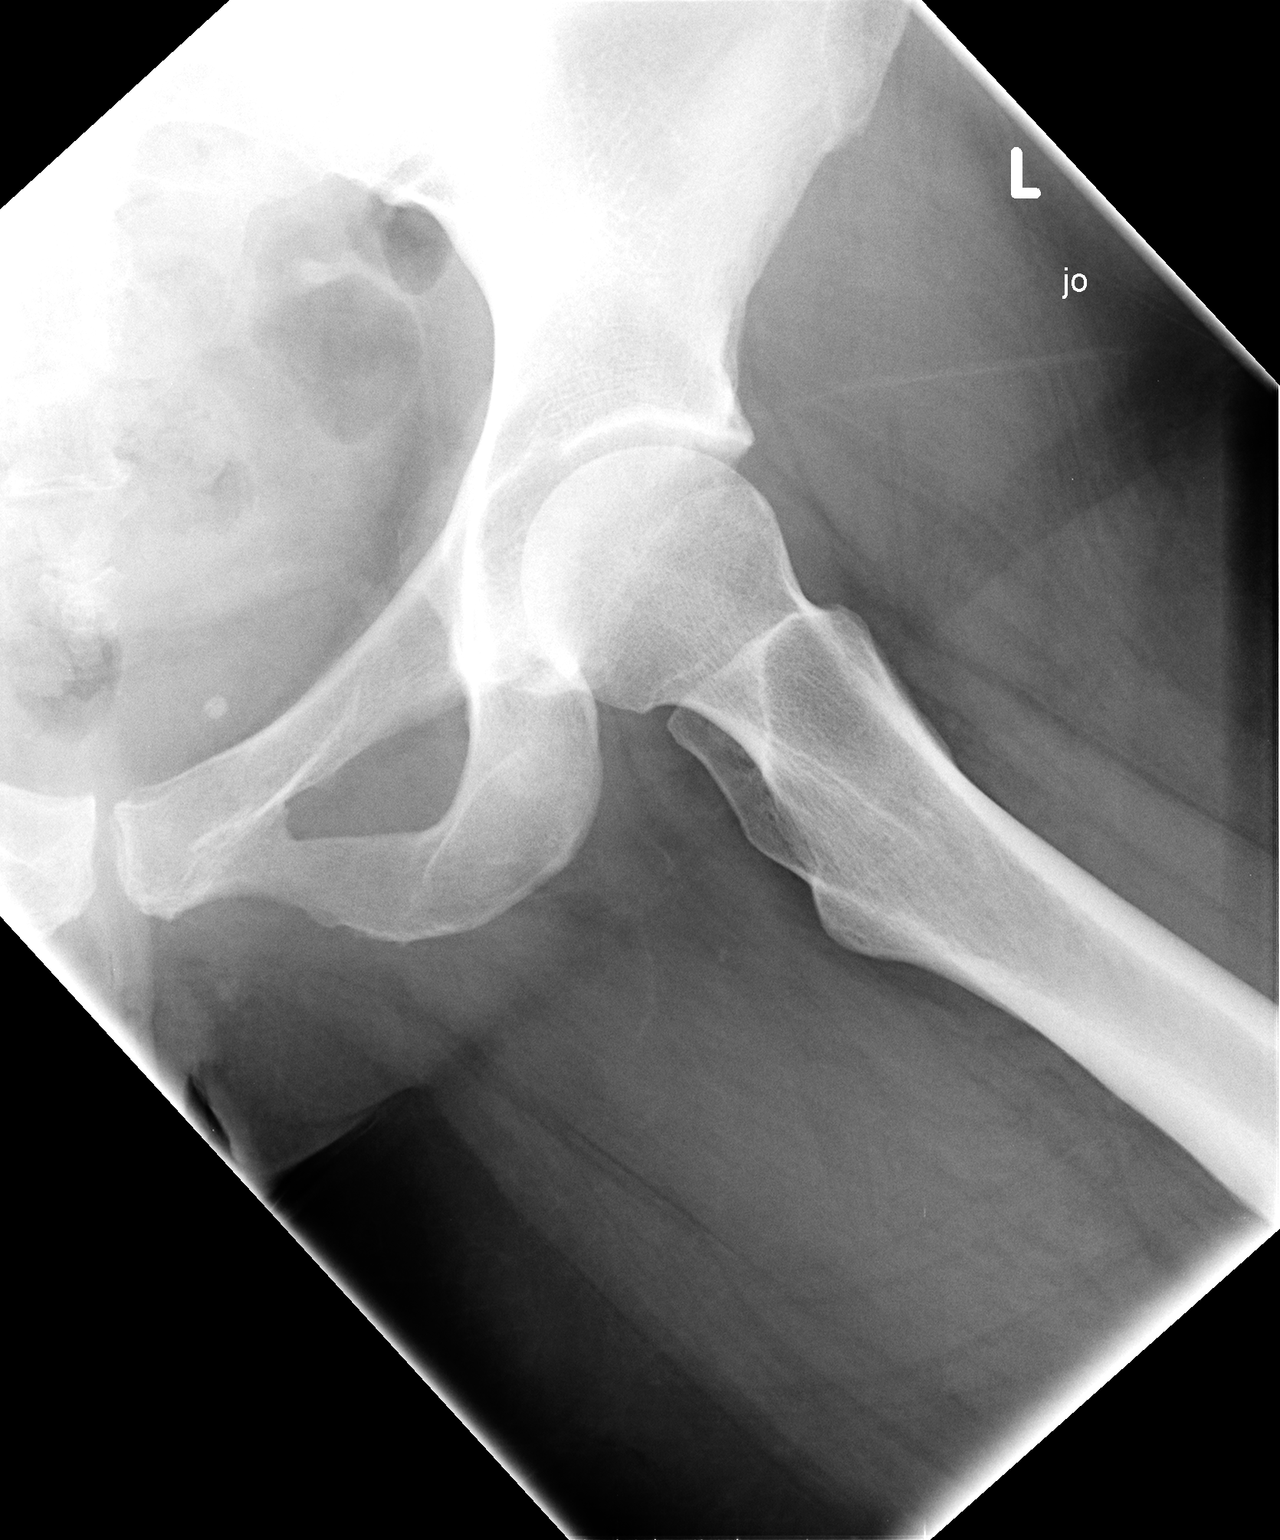

[2 of 2 positions shown; findings below may reference images not displayed]

FINDINGS: The left hip joint space appears normal.  Mineralization
appears normal.  No lytic or blastic lesion is seen.  The left SI
joint is unremarkable.
IMPRESSION: Negative left hip.

## 2012-06-12 ENCOUNTER — Other Ambulatory Visit: Payer: Self-pay

## 2012-06-12 ENCOUNTER — Ambulatory Visit
Admission: RE | Admit: 2012-06-12 | Discharge: 2012-06-12 | Disposition: A | Discharge status: Home / Self Care | Payer: BC Managed Care – PPO | Source: Ambulatory Visit

## 2012-06-12 ENCOUNTER — Other Ambulatory Visit: Payer: Self-pay | Admitting: Oncology

## 2012-06-12 DIAGNOSIS — M25572 Pain in left ankle and joints of left foot: Secondary | ICD-10-CM

## 2012-06-12 DIAGNOSIS — C50919 Malignant neoplasm of unspecified site of unspecified female breast: Secondary | ICD-10-CM

## 2012-07-03 ENCOUNTER — Other Ambulatory Visit: Payer: Self-pay | Admitting: Obstetrics and Gynecology

## 2012-12-13 ENCOUNTER — Other Ambulatory Visit: Payer: Self-pay | Admitting: Oncology

## 2012-12-13 ENCOUNTER — Other Ambulatory Visit: Payer: Self-pay | Admitting: Physician Assistant

## 2012-12-13 DIAGNOSIS — Z9889 Other specified postprocedural states: Secondary | ICD-10-CM

## 2012-12-13 DIAGNOSIS — Z853 Personal history of malignant neoplasm of breast: Secondary | ICD-10-CM

## 2013-01-03 ENCOUNTER — Other Ambulatory Visit: Payer: Self-pay | Admitting: Oncology

## 2013-01-03 ENCOUNTER — Ambulatory Visit
Admission: RE | Admit: 2013-01-03 | Discharge: 2013-01-03 | Disposition: A | Discharge status: Home / Self Care | Payer: BC Managed Care – PPO | Source: Ambulatory Visit | Attending: Oncology | Admitting: Oncology

## 2013-01-03 DIAGNOSIS — N6320 Unspecified lump in the left breast, unspecified quadrant: Secondary | ICD-10-CM

## 2013-01-03 DIAGNOSIS — Z9889 Other specified postprocedural states: Secondary | ICD-10-CM

## 2013-01-03 DIAGNOSIS — Z853 Personal history of malignant neoplasm of breast: Secondary | ICD-10-CM

## 2013-01-09 ENCOUNTER — Other Ambulatory Visit: Payer: Self-pay | Admitting: Oncology

## 2013-01-09 DIAGNOSIS — N6489 Other specified disorders of breast: Secondary | ICD-10-CM

## 2013-01-09 DIAGNOSIS — R922 Inconclusive mammogram: Secondary | ICD-10-CM

## 2013-01-17 ENCOUNTER — Ambulatory Visit
Admission: RE | Admit: 2013-01-17 | Discharge: 2013-01-17 | Disposition: A | Discharge status: Home / Self Care | Payer: BC Managed Care – PPO | Source: Ambulatory Visit | Attending: Oncology | Admitting: Oncology

## 2013-01-17 DIAGNOSIS — R922 Inconclusive mammogram: Secondary | ICD-10-CM

## 2013-01-17 DIAGNOSIS — N6489 Other specified disorders of breast: Secondary | ICD-10-CM

## 2013-03-14 ENCOUNTER — Telehealth: Payer: Self-pay | Admitting: *Deleted

## 2013-03-14 NOTE — Telephone Encounter (Signed)
Pt called to cancel her appts for 04/02/13 and to r/s for 04/06/13@ 8:45am...td

## 2013-04-02 ENCOUNTER — Ambulatory Visit: Payer: BC Managed Care – PPO | Admitting: Physician Assistant

## 2013-04-04 ENCOUNTER — Telehealth: Payer: Self-pay | Admitting: Oncology

## 2013-04-04 NOTE — Telephone Encounter (Signed)
, °

## 2013-04-06 ENCOUNTER — Ambulatory Visit: Payer: BC Managed Care – PPO | Admitting: Physician Assistant

## 2013-04-18 ENCOUNTER — Ambulatory Visit (HOSPITAL_BASED_OUTPATIENT_CLINIC_OR_DEPARTMENT_OTHER): Payer: BC Managed Care – PPO | Admitting: Physician Assistant

## 2013-04-18 ENCOUNTER — Encounter (INDEPENDENT_AMBULATORY_CARE_PROVIDER_SITE_OTHER): Payer: Self-pay

## 2013-04-18 ENCOUNTER — Telehealth: Payer: Self-pay | Admitting: Physician Assistant

## 2013-04-18 ENCOUNTER — Encounter: Payer: Self-pay | Admitting: Physician Assistant

## 2013-04-18 ENCOUNTER — Ambulatory Visit (HOSPITAL_BASED_OUTPATIENT_CLINIC_OR_DEPARTMENT_OTHER): Payer: BC Managed Care – PPO

## 2013-04-18 VITALS — BP 122/86 | HR 74 | Temp 98.4°F | Resp 18 | Ht 68.0 in | Wt 197.2 lb

## 2013-04-18 DIAGNOSIS — N920 Excessive and frequent menstruation with regular cycle: Secondary | ICD-10-CM

## 2013-04-18 DIAGNOSIS — D0590 Unspecified type of carcinoma in situ of unspecified breast: Secondary | ICD-10-CM

## 2013-04-18 DIAGNOSIS — T8389XA Other specified complication of genitourinary prosthetic devices, implants and grafts, initial encounter: Secondary | ICD-10-CM

## 2013-04-18 DIAGNOSIS — Z853 Personal history of malignant neoplasm of breast: Secondary | ICD-10-CM

## 2013-04-18 DIAGNOSIS — Z170 Estrogen receptor positive status [ER+]: Secondary | ICD-10-CM

## 2013-04-18 LAB — COMPREHENSIVE METABOLIC PANEL (CC13)
ALBUMIN: 4.1 g/dL (ref 3.5–5.0)
ALT: 15 U/L (ref 0–55)
AST: 18 U/L (ref 5–34)
Alkaline Phosphatase: 33 U/L — ABNORMAL LOW (ref 40–150)
Anion Gap: 11 mEq/L (ref 3–11)
BUN: 14.6 mg/dL (ref 7.0–26.0)
CALCIUM: 9.9 mg/dL (ref 8.4–10.4)
CO2: 25 mEq/L (ref 22–29)
Chloride: 106 mEq/L (ref 98–109)
Creatinine: 0.8 mg/dL (ref 0.6–1.1)
Glucose: 109 mg/dl (ref 70–140)
POTASSIUM: 4.5 meq/L (ref 3.5–5.1)
SODIUM: 142 meq/L (ref 136–145)
TOTAL PROTEIN: 7.3 g/dL (ref 6.4–8.3)
Total Bilirubin: 0.61 mg/dL (ref 0.20–1.20)

## 2013-04-18 LAB — CBC WITH DIFFERENTIAL/PLATELET
BASO%: 0.8 % (ref 0.0–2.0)
Basophils Absolute: 0.1 10*3/uL (ref 0.0–0.1)
EOS ABS: 0 10*3/uL (ref 0.0–0.5)
EOS%: 0.6 % (ref 0.0–7.0)
HCT: 41.2 % (ref 34.8–46.6)
HGB: 13.9 g/dL (ref 11.6–15.9)
LYMPH%: 23.4 % (ref 14.0–49.7)
MCH: 29.2 pg (ref 25.1–34.0)
MCHC: 33.8 g/dL (ref 31.5–36.0)
MCV: 86.5 fL (ref 79.5–101.0)
MONO#: 0.6 10*3/uL (ref 0.1–0.9)
MONO%: 8.6 % (ref 0.0–14.0)
NEUT%: 66.6 % (ref 38.4–76.8)
NEUTROS ABS: 4.3 10*3/uL (ref 1.5–6.5)
Platelets: 306 10*3/uL (ref 145–400)
RBC: 4.76 10*6/uL (ref 3.70–5.45)
RDW: 12.6 % (ref 11.2–14.5)
WBC: 6.5 10*3/uL (ref 3.9–10.3)
lymph#: 1.5 10*3/uL (ref 0.9–3.3)

## 2013-04-18 LAB — FERRITIN CHCC: FERRITIN: 35 ng/mL (ref 9–269)

## 2013-04-18 NOTE — Progress Notes (Signed)
ID: Ardine Bjork   DOB: 07-13-1970  MR#: [id number]  CSN#:[id number]  PCP: Wynelle Fanny GYN: Bradly Bienenstock, MD SU: Osborn Coho, MD OTHER MD: Arlice Colt, MD  CHIEF COMPLAINT:  Hx of Right Breast Cancer   BREAST CANCER HISTORY:  Kristain had her first mammogram ever January 2012.  This screening study showed a possible mass in the right breast.  She was recalled for additional views and diagnostic mammography on January 18 showed a small cluster of microcalcifications laterally and inferior in the right breast.  They were considered the slightly suspicious.  On physical exam there were small palpable mobile masses in the right breast at the 12 o'clock position.  However, ultrasound showed these to be simple cysts.   On February 7 Dr. 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  The patient was then referred to Dr. Margot Chimes, and after appropriate discussion on March 14 the patient underwent needle-guided excision of the area in question.  This showed ([id number]) ductal carcinoma in situ, low grade, spanning approximately 0.5 cm, focally within 1 mm of the posterior margin.  The tumor was estrogen receptor positive at 94% and progesterone receptor positive at 100%.   Dr. Margot Chimes and Dr. Tammi Klippel discussed the margin and issue and their feeling was that the patient's risk of recurrence was sufficiently low with the addition of radiation that the margin clearance would not make a significant contribution.  The patient then proceeded to radiation under Dr. Tammi Klippel and this was completed June 6.   She began on tamoxifen in September 2012, 20 mg daily with good tolerance.  Subsequent history is as noted below.  INTERVAL HISTORY: Opal returns alone today for routine one-year followup of her right breast carcinoma. 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 She is going through with her divorce and is dating again. Overall, she tells me things are "going well".   Malika continues on tamoxifen with good tolerance. She has no significant problems with hot flashes. She's had no change in vision. She's had no evidence of blood clots. Interval history is notable for the fact that she had a Paragard IUD placed in May of 2014 under the care of Dr.  Suzzette Righter.  This particular IUD was chosen due to its lack of hormones. Prior to the IUD placement, Kaydee was having somewhat irregular, light periods. Since the IUD placement, however, her cycles have become very irregular and she is bleeding very heavily with each cycle and has started having menstrual cramping.    REVIEW OF SYSTEMS: Rosario denies any recent illnesses and has had no recent fevers or chills. She denies any skin changes and has had no abnormal bruising or bleeding. Her energy level is fair. She does have some fatigue, especially in the evenings. She has some shortness of breath with exertion, but denies any cough, phlegm production, chest pain, or palpitations. She is eating well with no nausea, emesis, or change in bowel or bladder habits. She's had no abnormal headaches, dizziness, or change in vision. Currently, she denies any unusual myalgias, arthralgias, or bony pain.  A detailed review of systems is otherwise stable and noncontributory.   PAST MEDICAL HISTORY: Past Medical History  Diagnosis Date  . MS (multiple sclerosis)   . Breast cancer 03/24/10    /04/29/10 lumpectomt dcis  . Multiple sclerosis  10 years,tx with interferon  . Anemia     during pregnancy  . Anxiety     PAST SURGICAL HISTORY: Past Surgical History  Procedure Laterality Date  . Laparoscopic cholecystectomy  07/26/08  . Breast lumpectomy  04/29/10    right    FAMILY HISTORY Family History  Problem Relation Age of Onset  . Cancer Father     bladder  . Aneurysm  Father    GYN HISTORY:  (Updated 04/18/2013) Menarche age 39, first pregnancy to term at age 19.  The patient is Gx P1. She still having irregular periods at the time of her breast cancer diagnosis. Status post insertion of Paragard IUD in May 2014.   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  She was married for 20 years but is currently undergoing a divorce. At home is just she and 2 dogs. Her daughter attends [employer].  The patient is not a church attender.   ADVANCED DIRECTIVES: Not in place  HEALTH MAINTENANCE:  (updated 04/18/2013) History  Substance Use Topics  . Smoking status: Former Smoker -- 0.50 packs/day    Types: Cigarettes  . Smokeless tobacco: Never Used  . Alcohol Use: Yes     Comment: socially     Colonoscopy: never  PAP: UTD/Dr. Suzzette Righter  Bone density: Never  Lipid panel: Not on file  No Known Allergies  Current Outpatient Prescriptions  Medication Sig Dispense Refill  . ibuprofen (ADVIL,MOTRIN) 800 MG tablet Take 800 mg by mouth every 8 (eight) hours as needed.      Marland Kitchen PARAGARD INTRAUTERINE COPPER IUD IUD 1 each by Intrauterine route once.      . tamoxifen (NOLVADEX) 20 MG tablet TAKE 1 TABLET BY MOUTH EVERY DAY  90 tablet  PRN  . acetaminophen (TYLENOL) 160 MG tablet Take 160 mg by mouth as needed.        . Multiple Vitamin (MULTIVITAMIN) tablet Take 1 tablet by mouth daily.       No current facility-administered medications for this visit.    OBJECTIVE: Young white woman appears well and is in no acute distress Filed Vitals:   04/18/13 0847  BP: 122/86  Pulse: 74  Temp: 98.4 F (36.9 C)  Resp: 18     Body mass index is 29.99 kg/(m^2).    ECOG FS: 0 Filed Weights   04/18/13 0847  Weight: 197 lb 3.2 oz (89.449 kg)   Physical Exam: HEENT:  Sclerae anicteric.  Oropharynx clear, pink, and moist. Neck supple, trachea midline. No thyromegaly palpated.  NODES:  No cervical or supraclavicular  lymphadenopathy palpated.  BREAST EXAM:  Right breast is status post lumpectomy. Well-healed incision with no suspicious nodularities or skin changes, and no evidence of local recurrence. Left breast is unremarkable. Axillae are benign bilaterally, with no lymphadenopathy. LUNGS:  Clear to auscultation bilaterally.  No wheezes or rhonchi HEART:  Regular rate and rhythm. No murmur appreciated ABDOMEN:  Soft, nontender.  Positive bowel sounds.  MSK:  No focal spinal tenderness to palpation. Good range of motion bilaterally in the upper extremities. EXTREMITIES:  No peripheral edema.  No lymphedema in the upper extremities. SKIN:  Benign with no visible rashes or skin lesions. No excessive ecchymoses. No petechiae. No significant pallor. NEURO:  Nonfocal. Well oriented.  Positive affect.    LAB RESULTS: Lab Results  Component Value Date   WBC 6.3 04/03/2012   NEUTROABS 4.4 04/03/2012   HGB 13.7 04/03/2012  HCT 40.5 04/03/2012   MCV 89.2 04/03/2012   PLT 270 04/03/2012      Chemistry      Component Value Date/Time   NA 139 04/03/2012 0847   NA 139 05/31/2011 0912   K 4.2 04/03/2012 0847   K 4.0 05/31/2011 0912   CL 107 04/03/2012 0847   CL 105 05/31/2011 0912   CO2 24 04/03/2012 0847   CO2 24 05/31/2011 0912   BUN 16.0 04/03/2012 0847   BUN 12 05/31/2011 0912   CREATININE 0.8 04/03/2012 0847   CREATININE 0.71 05/31/2011 0912      Component Value Date/Time   CALCIUM 9.5 04/03/2012 0847   CALCIUM 8.8 05/31/2011 0912   ALKPHOS 28* 04/03/2012 0847   ALKPHOS 25* 05/31/2011 0912   AST 17 04/03/2012 0847   AST 12 05/31/2011 0912   ALT 20 04/03/2012 0847   ALT 13 05/31/2011 0912   BILITOT 0.49 04/03/2012 0847   BILITOT 0.4 05/31/2011 0912       STUDIES:  MM Digital Diagnostic Bilat/US Breast Left 01/03/2014    F. Altamese Cabal Sr., MD Wed Jan 17, 2013 5:08:20 PM EST       ADDENDUM REPORT: 01/17/2013 17:05  ADDENDUM:  Patient called concerned about her area within the left breast and   asked me to look at the films and look at her once again with  ultrasound as I had seen her cancer previously. Rolled CC views were  performed and these showed the area of asymmetry to be consistent  with a summation shadow. Repeat ultrasound demonstrated no  abnormality in the area of concern and the appearance is consistent  with a summation shadow. Following discussion with patient, it was  decided that she could return for a bilateral diagnostic mammogram  in 1 year.  RECOMMENDATION: Bilateral diagnostic mammography in 1 year.  1: Negative  Electronically Signed  By: Luberta Robertson M.D.  On: 01/17/2013 17:05       Study Result    CLINICAL DATA: 43 year old female for annual bilateral mammograms.  History of right breast cancer and right lumpectomy in 2012.  EXAM:  DIGITAL DIAGNOSTIC BILATERAL MAMMOGRAM WITH CAD  ULTRASOUND LEFT BREAST  COMPARISON: None.  ACR Breast Density Category b: There are scattered areas of  fibroglandular density.  FINDINGS:  Right breast scarring is again noted.  An asymmetric density within the inner left breast is present.  No suspicious mass, nonsurgical distortion or worrisome  calcifications are identified within the right breast.  Mammographic images were processed with CAD.  On physical exam,no palpable abnormalities are identified within the  inner left breast.  Ultrasound is performed, showing no evidence of solid mass,  distortion or abnormal areas of shadowing within the entire inner  left breast.  IMPRESSION:  Asymmetric density within the inner left breast without palpable or  sonographic correlate. This likely represents normal fibroglandular  tissue but 6 month followup is recommended to ensure stability.  Right breast scarring.  No mammographic evidence of right breast malignancy.  RECOMMENDATION:  Left diagnostic mammogram with possible left breast ultrasound in 6  months.  I have discussed the findings and recommendations  with the patient.  Results were also provided in writing at the conclusion of the  visit.  BI-RADS CATEGORY 3: Probably benign finding(s) - short interval  follow-up suggested.  Electronically Signed:  By: Hassan Rowan M.D.  On: 01/03/2013 17:13        ASSESSMENT: A 43 year old BRCA negative [place] woman originally from [place]  [place]   (1)  status post right lumpectomy March 2012 for ductal carcinoma in situ, low grade measuring 5 mm with some evidence of necrosis and a focally close but technically negative posterior margin.  The tumor was strongly estrogen and progesterone receptor positive.    (2)  She completed radiation in June 2012.   (3)   she began on tamoxifen in September 2012, the plan being to continue for total of 5 years (until September 2017).  (4)  Multiple Sclerosis, diagnosed in 2000  (5)  status post placement of ParaGard IUD May 2014 with subsequent menorrhagia  PLAN: Rexanna appears to be doing well with regards to her breast cancer, and there is no clinical evidence of disease recurrence. Her next annual mammogram will be due in December 2015.  I have also reviewed her case with Dr. Jana Hakim. Since she is on tamoxifen which is a hormone receptor "blocker", and since she is not tolerating the ParaGard very well, he would be very comfortable with Tanaka being on an IUD with some hormonal component such as a Mirena.  We will send a letter to Denee is gynecologist, Dr. Suzzette Righter, to let her know this. Deberah is scheduled to followup with her again next month.  Fortunately, despite the menorrhagia, her hemoglobin is normal today as is her MCV. We are awaiting her ferritin level, but I expect that he will also be within normal range.  Zakirah will continue on tamoxifen, and this has been refilled for her for another year. She will return to see [place] again next March, 2016, but knows to call prior to that time with any changes or problems. She voices both her  understanding and agreement with this plan.   Harue Pribble PA-C    04/18/2013

## 2013-04-18 NOTE — Telephone Encounter (Signed)
, °

## 2013-07-25 ENCOUNTER — Other Ambulatory Visit: Payer: Self-pay | Admitting: Oncology

## 2013-07-25 DIAGNOSIS — N63 Unspecified lump in unspecified breast: Secondary | ICD-10-CM

## 2013-07-30 ENCOUNTER — Ambulatory Visit
Admission: RE | Admit: 2013-07-30 | Discharge: 2013-07-30 | Disposition: A | Discharge status: Home / Self Care | Payer: BC Managed Care – PPO | Source: Ambulatory Visit | Attending: Oncology | Admitting: Oncology

## 2013-07-30 ENCOUNTER — Other Ambulatory Visit: Payer: Self-pay | Admitting: Oncology

## 2013-07-30 DIAGNOSIS — N63 Unspecified lump in unspecified breast: Secondary | ICD-10-CM

## 2013-08-31 ENCOUNTER — Other Ambulatory Visit: Payer: Self-pay | Admitting: Oncology

## 2013-08-31 DIAGNOSIS — Z853 Personal history of malignant neoplasm of breast: Secondary | ICD-10-CM

## 2013-12-17 ENCOUNTER — Encounter: Payer: Self-pay | Admitting: Physician Assistant

## 2014-01-14 ENCOUNTER — Ambulatory Visit
Admission: RE | Admit: 2014-01-14 | Discharge: 2014-01-14 | Disposition: A | Discharge status: Home / Self Care | Payer: BC Managed Care – PPO | Source: Ambulatory Visit | Attending: Physician Assistant | Admitting: Physician Assistant

## 2014-01-14 DIAGNOSIS — Z853 Personal history of malignant neoplasm of breast: Secondary | ICD-10-CM

## 2014-04-22 ENCOUNTER — Telehealth: Payer: Self-pay | Admitting: Oncology

## 2014-04-22 NOTE — Telephone Encounter (Signed)
returned call and lvm for pt to call back to r/s 3.8 appt

## 2014-04-23 ENCOUNTER — Other Ambulatory Visit: Payer: BC Managed Care – PPO

## 2014-04-24 ENCOUNTER — Telehealth: Payer: Self-pay | Admitting: Nurse Practitioner

## 2014-04-24 NOTE — Telephone Encounter (Signed)
S/w pt and she is aware of her new appts due to hold chemo times      Anne/melissa

## 2014-04-30 ENCOUNTER — Ambulatory Visit: Payer: BC Managed Care – PPO | Admitting: Nurse Practitioner

## 2014-04-30 ENCOUNTER — Other Ambulatory Visit: Payer: Self-pay | Admitting: *Deleted

## 2014-04-30 ENCOUNTER — Ambulatory Visit: Payer: BC Managed Care – PPO | Admitting: Physician Assistant

## 2014-04-30 DIAGNOSIS — C50919 Malignant neoplasm of unspecified site of unspecified female breast: Secondary | ICD-10-CM

## 2014-05-01 ENCOUNTER — Other Ambulatory Visit (HOSPITAL_BASED_OUTPATIENT_CLINIC_OR_DEPARTMENT_OTHER): Payer: BLUE CROSS/BLUE SHIELD

## 2014-05-01 DIAGNOSIS — D0511 Intraductal carcinoma in situ of right breast: Secondary | ICD-10-CM

## 2014-05-01 DIAGNOSIS — C50919 Malignant neoplasm of unspecified site of unspecified female breast: Secondary | ICD-10-CM

## 2014-05-01 LAB — CBC WITH DIFFERENTIAL/PLATELET
BASO%: 0.7 % (ref 0.0–2.0)
Basophils Absolute: 0.1 10*3/uL (ref 0.0–0.1)
EOS ABS: 0 10*3/uL (ref 0.0–0.5)
EOS%: 0.6 % (ref 0.0–7.0)
HCT: 37.8 % (ref 34.8–46.6)
HGB: 12.5 g/dL (ref 11.6–15.9)
LYMPH%: 31.8 % (ref 14.0–49.7)
MCH: 28.2 pg (ref 25.1–34.0)
MCHC: 33.2 g/dL (ref 31.5–36.0)
MCV: 85 fL (ref 79.5–101.0)
MONO#: 0.7 10*3/uL (ref 0.1–0.9)
MONO%: 8.3 % (ref 0.0–14.0)
NEUT%: 58.6 % (ref 38.4–76.8)
NEUTROS ABS: 4.8 10*3/uL (ref 1.5–6.5)
Platelets: 377 10*3/uL (ref 145–400)
RBC: 4.44 10*6/uL (ref 3.70–5.45)
RDW: 12.4 % (ref 11.2–14.5)
WBC: 8.2 10*3/uL (ref 3.9–10.3)
lymph#: 2.6 10*3/uL (ref 0.9–3.3)

## 2014-05-01 LAB — COMPREHENSIVE METABOLIC PANEL (CC13)
ALBUMIN: 3.6 g/dL (ref 3.5–5.0)
ALT: 32 U/L (ref 0–55)
ANION GAP: 10 meq/L (ref 3–11)
AST: 28 U/L (ref 5–34)
Alkaline Phosphatase: 28 U/L — ABNORMAL LOW (ref 40–150)
BUN: 10.4 mg/dL (ref 7.0–26.0)
CALCIUM: 8.8 mg/dL (ref 8.4–10.4)
CHLORIDE: 108 meq/L (ref 98–109)
CO2: 23 mEq/L (ref 22–29)
CREATININE: 0.7 mg/dL (ref 0.6–1.1)
EGFR: >90 mL/min/{1.73_m2} (ref >90)
GLUCOSE: 97 mg/dL (ref 70–140)
Potassium: 3.9 mEq/L (ref 3.5–5.1)
Sodium: 141 mEq/L (ref 136–145)
Total Bilirubin: 0.38 mg/dL (ref 0.20–1.20)
Total Protein: 6.6 g/dL (ref 6.4–8.3)

## 2014-05-08 ENCOUNTER — Ambulatory Visit (HOSPITAL_BASED_OUTPATIENT_CLINIC_OR_DEPARTMENT_OTHER): Payer: BLUE CROSS/BLUE SHIELD | Admitting: Nurse Practitioner

## 2014-05-08 ENCOUNTER — Telehealth: Payer: Self-pay | Admitting: Oncology

## 2014-05-08 ENCOUNTER — Encounter: Payer: Self-pay | Admitting: Nurse Practitioner

## 2014-05-08 ENCOUNTER — Other Ambulatory Visit: Payer: Self-pay | Admitting: *Deleted

## 2014-05-08 VITALS — BP 127/78 | HR 72 | Temp 98.3°F | Resp 18 | Ht 68.0 in | Wt 198.3 lb

## 2014-05-08 DIAGNOSIS — N926 Irregular menstruation, unspecified: Secondary | ICD-10-CM

## 2014-05-08 DIAGNOSIS — Z853 Personal history of malignant neoplasm of breast: Secondary | ICD-10-CM

## 2014-05-08 DIAGNOSIS — C50911 Malignant neoplasm of unspecified site of right female breast: Secondary | ICD-10-CM

## 2014-05-08 DIAGNOSIS — Z79810 Long term (current) use of selective estrogen receptor modulators (SERMs): Secondary | ICD-10-CM

## 2014-05-08 MED ORDER — TAMOXIFEN CITRATE 20 MG PO TABS: 20.0000 mg | ORAL_TABLET | Freq: Every day | ORAL | Status: DC

## 2014-05-08 NOTE — Telephone Encounter (Signed)
per pof ot sch pt appt-gave pt copy of sch °

## 2014-05-08 NOTE — Progress Notes (Signed)
ID: Traci Watkins   DOB: 11-05-1970  MR#: [id number]  GGY#:[id number]  PCP: Wynelle Fanny GYN: Bradly Bienenstock, MD SU: Osborn Coho, MD OTHER MD: Arlice Colt, MD  CHIEF COMPLAINT:  Hx of Right Breast Cancer   BREAST CANCER HISTORY:  Traci Watkins had her first mammogram ever January 2012.  This screening study showed a possible mass in the right breast.  She was recalled for additional views and diagnostic mammography on January 18 showed a small cluster of microcalcifications laterally and inferior in the right breast.  They were considered the slightly suspicious.  On physical exam there were small palpable mobile masses in the right breast at the 12 o'clock position.  However, ultrasound showed these to be simple cysts.   On February 7 Dr. 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  The patient was then referred to Dr. Margot Chimes, and after appropriate discussion on March 14 the patient underwent needle-guided excision of the area in question.  This showed ([id number]) ductal carcinoma in situ, low grade, spanning approximately 0.5 cm, focally within 1 mm of the posterior margin.  The tumor was estrogen receptor positive at 94% and progesterone receptor positive at 100%.   Dr. Margot Chimes and Dr. Tammi Klippel discussed the margin and issue and their feeling was that the patient's risk of recurrence was sufficiently low with the addition of radiation that the margin clearance would not make a significant contribution.  The patient then proceeded to radiation under Dr. Tammi Klippel and this was completed June 6.   She began on tamoxifen in September 2012, 20 mg daily with good tolerance.  Subsequent history is as noted below.  INTERVAL HISTORY: Traci Watkins returns alone today for routine one-year follow up of her right breast carcinoma. She has been on tamoxifen since September 2012 and is tolerating it reasonably  well. She has minimal hot flashes and denies vaginal discharge. She had a colonoscopy earlier this year and had a few benign polyps removed. Her main complaint today is irregular periods. She was having regular periods, albeit painful, through the summer, then from September to December she didn't have one. She is prescribed 848m ibuprofen PRN for the pain. She skipped in January, and in February it lasted one day. Yesterday she thought her period was going to begin, as in she had the cramps and discomfort, but the bleeding never actually began. She had a paraguard IUD placed in May 2014 under the care of Dr. VSuzzette Righter She wonders if the tamoxfien is messing with her regulariety  REVIEW OF SYSTEMS: SKiyodenies fevers, chills, nausea, vomiting, or changes in bowel or bladder habits. She has no shortness of breath, chest pain, cough, or palpitations. She is gaining some weight and would like to lose it. Currently she walks/jogs about 20 minutes daily on the treadmill. She has no headaches, dizziness, vision changes, or night sweats. She has no pain outside of her period. A detailed review of systems is otherwise stable.   PAST MEDICAL HISTORY: Past Medical History  Diagnosis Date  . MS (multiple sclerosis)   . Breast cancer 03/24/10    /04/29/10 lumpectomt dcis  . Multiple sclerosis     10 years,tx with interferon  . Anemia     during pregnancy  . Anxiety     PAST SURGICAL HISTORY: Past Surgical History  Procedure Laterality Date  . Laparoscopic cholecystectomy  07/26/08  . Breast lumpectomy  04/29/10    right  FAMILY HISTORY Family History  Problem Relation Age of Onset  . Cancer Father     bladder  . Aneurysm Father    GYN HISTORY:  (Updated 04/18/2013) Menarche age 23, first pregnancy to term at age 78.  The patient is Gx P1. She still having irregular periods at the time of her breast cancer diagnosis. Status post insertion of Paragard IUD in May 2014.   SOCIAL HISTORY:      (updated 04/18/2013)  Traci Watkins works for [employer] in [employer].  She was married for 20 years but is currently undergoing a divorce. At home is just she and 2 dogs. Her daughter attends [employer].  The patient is not a church attender.   ADVANCED DIRECTIVES: Not in place  HEALTH MAINTENANCE:  (updated 04/18/2013) History  Substance Use Topics  . Smoking status: Former Smoker -- 0.50 packs/day    Types: Cigarettes  . Smokeless tobacco: Never Used  . Alcohol Use: Yes     Comment: socially     Colonoscopy: never  PAP: UTD/Dr. Suzzette Righter  Bone density: Never  Lipid panel: Not on file  No Known Allergies  Current Outpatient Prescriptions  Medication Sig Dispense Refill  . ibuprofen (ADVIL,MOTRIN) 800 MG tablet Take 800 mg by mouth every 8 (eight) hours as needed.    Marland Kitchen PARAGARD INTRAUTERINE COPPER IUD IUD 1 each by Intrauterine route once.    . tamoxifen (NOLVADEX) 20 MG tablet TAKE 1 TABLET BY MOUTH EVERY DAY 90 tablet 2  . acetaminophen (TYLENOL) 160 MG tablet Take 160 mg by mouth as needed.      . Multiple Vitamin (MULTIVITAMIN) tablet Take 1 tablet by mouth daily.     No current facility-administered medications for this visit.    OBJECTIVE: Young white woman appears well and is in no acute distress Filed Vitals:   05/08/14 0843  BP: 127/78  Pulse: 72  Temp: 98.3 F (36.8 C)  Resp: 18     Body mass index is 30.16 kg/(m^2).    ECOG FS: 0 Filed Weights   05/08/14 0843  Weight: 198 lb 4.8 oz (89.948 kg)   Physical Exam: Skin: warm, dry  HEENT: sclerae anicteric, conjunctivae pink, oropharynx clear. No thrush or mucositis.  Lymph Nodes: No cervical or supraclavicular lymphadenopathy  Lungs: clear to auscultation bilaterally, no rales, wheezes, or rhonci  Heart: regular rate and rhythm  Abdomen: round, soft, non tender, positive bowel sounds  Musculoskeletal: No focal spinal tenderness, no peripheral edema  Neuro: non focal, well oriented, positive affect   Breasts: right breast status post lumpectomy and radiation. No evidence of recurrent disease. Right axilla benign. Left breast unremarkable.   LAB RESULTS: Lab Results  Component Value Date   WBC 8.2 05/01/2014   NEUTROABS 4.8 05/01/2014   HGB 12.5 05/01/2014   HCT 37.8 05/01/2014   MCV 85.0 05/01/2014   PLT 377 05/01/2014      Chemistry      Component Value Date/Time   NA 141 05/01/2014 1605   NA 139 05/31/2011 0912   K 3.9 05/01/2014 1605   K 4.0 05/31/2011 0912   CL 107 04/03/2012 0847   CL 105 05/31/2011 0912   CO2 23 05/01/2014 1605   CO2 24 05/31/2011 0912   BUN 10.4 05/01/2014 1605   BUN 12 05/31/2011 0912   CREATININE 0.7 05/01/2014 1605   CREATININE 0.71 05/31/2011 0912      Component Value Date/Time   CALCIUM 8.8 05/01/2014 1605   CALCIUM 8.8 05/31/2011  0912   ALKPHOS 28* 05/01/2014 1605   ALKPHOS 25* 05/31/2011 0912   AST 28 05/01/2014 1605   AST 12 05/31/2011 0912   ALT 32 05/01/2014 1605   ALT 13 05/31/2011 0912   BILITOT 0.38 05/01/2014 1605   BILITOT 0.4 05/31/2011 0912       STUDIES:  No results found.  Most recent mammogram on 01/14/14 was unremarkable.  ASSESSMENT: A 44 year old BRCA negative [place] woman originally from [place]   (1)  status post right lumpectomy March 2012 for ductal carcinoma in situ, low grade measuring 5 mm with some evidence of necrosis and a focally close but technically negative posterior margin.  The tumor was strongly estrogen and progesterone receptor positive.    (2)  She completed radiation in June 2012.   (3)   she began on tamoxifen in September 2012, the plan being to continue for total of 5 years (until September 2017).  (4)  Multiple Sclerosis, diagnosed in 2000  (5)  status post placement of ParaGard IUD May 2014 with subsequent menorrhagia  PLAN: Verlon is doing well as far as her breast cancer is concerned. She is now 4 years out from her definitive surgery, which no evidence of  recurrent disease. The labs were reviewed in detail and were entirely stable. She is tolerating the tamoxifen well and she is expected to continue it through September 2017.   I consulted with Dr. Jana Hakim and he spoke with the patient. Tamoxifen can create an environment where periods can certainly become irregular, but it is no cause for concern. As stated in the last progress note, because she is on the tamoxifen which blocks the hormone receptors, she would be eligible for a Mirena which does have a hormone component. Traci Watkins knows this but does not want to go through the process of removing and replacing her current IUD.   Traci Watkins will return for labs and an office visit in 1 year. She understands and agrees with this plan. She knows the goal of treatment in her case is cure. She has been encouraged to call with any issues that might arise before her next visit here.   Total time spent in this appointment was approximately 25 minutes, with greater than 50% of the time face-to-face with the patient.   Laurie Panda, NP    05/08/2014

## 2014-07-02 ENCOUNTER — Ambulatory Visit: Payer: BLUE CROSS/BLUE SHIELD | Admitting: Neurology

## 2014-07-03 ENCOUNTER — Ambulatory Visit (INDEPENDENT_AMBULATORY_CARE_PROVIDER_SITE_OTHER): Payer: BLUE CROSS/BLUE SHIELD | Admitting: Neurology

## 2014-07-03 ENCOUNTER — Encounter: Payer: Self-pay | Admitting: Neurology

## 2014-07-03 VITALS — BP 118/80 | HR 76 | Resp 16 | Ht 68.0 in | Wt 206.6 lb

## 2014-07-03 DIAGNOSIS — G4700 Insomnia, unspecified: Secondary | ICD-10-CM

## 2014-07-03 DIAGNOSIS — Z853 Personal history of malignant neoplasm of breast: Secondary | ICD-10-CM | POA: Diagnosis not present

## 2014-07-03 DIAGNOSIS — G35 Multiple sclerosis: Secondary | ICD-10-CM | POA: Diagnosis not present

## 2014-07-03 DIAGNOSIS — R208 Other disturbances of skin sensation: Secondary | ICD-10-CM | POA: Diagnosis not present

## 2014-07-03 MED ORDER — GABAPENTIN 600 MG PO TABS: 600.0000 mg | ORAL_TABLET | Freq: Every day | ORAL | Status: DC

## 2014-07-03 NOTE — Progress Notes (Signed)
[facility]  PATIENT: Traci Watkins DOB: December 09, 1970  REFERRING DOCTOR OR PCP:  Carlos Levering [facility]) SOURCE: patient and EMR records  _________________________________   HISTORICAL  CHIEF COMPLAINT:  Chief Complaint  Patient presents with  . Multiple Sclerosis    [name] is a pt. of Dr. Garth Bigness from [facility].  She stopped Betaseron around 2009 due to side effects.  Her last mri was 2-3 yrs. ago at [place].  She was dx. with breast cancer in 2011, is on Tamoxifen.  She denies new or worsening sx./fim    HISTORY OF PRESENT ILLNESS:  Traci Watkins is a 44 year old woman with relapsing remitting multiple sclerosis.  MS history: In 2000 presented with right facial numbness and later that year had left leg numbness. An MRI and lumbar puncture were performed. She was diagnosed with probable multiple sclerosis. One year later and another MRI was performed that showed additional lesions. She then was diagnosed with clinically definite multiple sclerosis. She was placed on the Betaseron she stopped Betaseron around 2009 due to side effects of the medication. Clinically, she had done well with no exacerbations.   At that time, she had had several stable MRIs during the interim additionally, she has had no further clinical relapses. We made an agreement that she will continue to get occasional MRI monitoring to make sure that there was not subclinical progression of her disease. Fortunately, several more MRIs over the last 6 years have shown no further disease activity. Additionally, she has had no clinical relapses and no progression of disability.  Breast cancer:  She has ductal carcinoma in situ breast cancer she was treated with a lumpectomy, radiation and placed on tamoxifen. She is to receive tamoxifen for 1 more year. She has had no further disease activity for her breast cancer and has had a recent evaluation by her oncologist.  Gait/strength/sensation: She has no  difficulty with her gait area she denies any weakness. Although there is no actual numbness she does have tingling in the left leg at times. She notes the left foot tingling mostly at night prior to going to bed.  Bowel/bladder: She denies any difficulty with bladder or bowel function.  Vision: She denies any MS related visual problems. She has never had optic neuritis or diplopia. There is no eye pain.  Fatigue/sleep. She denies any significant difficulty with fatigue during the daytime. She does note that she has some difficulty with insomnia. Specifically, she is able to fall asleep easily but she wakes up multiple times during the night and sometimes has difficulty falling back asleep. Her bed partner snores loudly.  Mood/cognition: She denies any depression or anxiety. She has not noted any cognitive issues at work or at home.    REVIEW OF SYSTEMS: Constitutional: No fevers, chills, sweats, or change in appetite Eyes: No visual changes, double vision, eye pain Ear, nose and throat: No hearing loss, ear pain, nasal congestion, sore throat Cardiovascular: No chest pain, palpitations Respiratory: No shortness of breath at rest or with exertion.   No wheezes GastrointestinaI: No nausea, vomiting, diarrhea, abdominal pain, fecal incontinence Genitourinary: No dysuria, urinary retention or frequency.  No nocturia. Musculoskeletal: No neck pain, back pain Integumentary: No rash, pruritus, skin lesions Neurological: as above Psychiatric: No depression at this time.  No anxiety Endocrine: No palpitations, diaphoresis, change in appetite, change in weigh or increased thirst Hematologic/Lymphatic: No anemia, purpura, petechiae. Allergic/Immunologic: No itchy/runny eyes, nasal congestion, recent allergic reactions, rashes  ALLERGIES: No Known Allergies  HOME MEDICATIONS:  Current outpatient prescriptions:  .  ibuprofen (ADVIL,MOTRIN) 800 MG tablet, Take 800 mg by mouth every 8 (eight)  hours as needed., Disp: , Rfl:  .  Multiple Vitamin (MULTIVITAMIN) tablet, Take 1 tablet by mouth daily., Disp: , Rfl:  .  PARAGARD INTRAUTERINE COPPER IUD IUD, 1 each by Intrauterine route once., Disp: , Rfl:  .  tamoxifen (NOLVADEX) 20 MG tablet, Take 1 tablet (20 mg total) by mouth daily., Disp: 90 tablet, Rfl: 3  PAST MEDICAL HISTORY: Past Medical History  Diagnosis Date  . MS (multiple sclerosis)   . Breast cancer 03/24/10    /04/29/10 lumpectomt dcis  . Multiple sclerosis     10 years,tx with interferon  . Anemia     during pregnancy  . Anxiety     PAST SURGICAL HISTORY: Past Surgical History  Procedure Laterality Date  . Laparoscopic cholecystectomy  07/26/08  . Breast lumpectomy  04/29/10    right    FAMILY HISTORY: Family History  Problem Relation Age of Onset  . Cancer Father     bladder  . Aneurysm Father     SOCIAL HISTORY:  History   Social History  . Marital Status: Married    Spouse Name: N/A  . Number of Children: 1  . Years of Education: N/A   Occupational History  . Barrister's clerk    Social History Main Topics  . Smoking status: Former Smoker -- 0.50 packs/day    Types: Cigarettes  . Smokeless tobacco: Never Used  . Alcohol Use: Yes     Comment: socially  . Drug Use: No     Comment: 10 years,quit 2008  . Sexual Activity: Not on file   Other Topics Concern  . Not on file   Social History Narrative     PHYSICAL EXAM  Filed Vitals:   07/03/14 0853  BP: 118/80  Pulse: 76  Resp: 16  Height: 5\' 8"  (1.727 m)  Weight: 206 lb 9.6 oz (93.713 kg)    Body mass index is 31.42 kg/(m^2).   General: The patient is well-developed and well-nourished and in no acute distress  Eyes:  Funduscopic exam shows normal optic discs and retinal vessels.  Neck: The neck is supple, no carotid bruits are noted.  The neck is nontender.  Cardiovascular: The heart has a regular rate and rhythm with a normal S1 and S2. There were no murmurs, gallops  or rubs.   Skin: Extremities are without significant edema.  Musculoskeletal:  Back is nontender  Neurologic Exam  Mental status: The patient is alert and oriented x 3 at the time of the examination. The patient has apparent normal recent and remote memory, with an apparently normal attention span and concentration ability.   Speech is normal.  Cranial nerves: Extraocular movements are full. Pupils are equal, round, and reactive to light and accomodation.  Visual fields are full.  Facial symmetry is present. There is good facial sensation to soft touch bilaterally.Facial strength is normal.  Trapezius and sternocleidomastoid strength is normal. No dysarthria is noted.  The tongue is midline, and the patient has symmetric elevation of the soft palate. No obvious hearing deficits are noted.  Motor:  Muscle bulk is normal.   Tone is normal. Strength is  5 / 5 in all 4 extremities.   Sensory: Sensory testing is intact to pinprick, soft touch and vibration sensation in all 4 extremities.  However, vibration sensation on the left had a slightly strange sensation compared to  the more normal sensation she felt on the right.  Coordination: Cerebellar testing reveals good finger-nose-finger and heel-to-shin bilaterally.  Gait and station: Station is normal.   Gait is normal. Tandem gait is very slightly wide. Romberg is negative.   Reflexes: Deep tendon reflexes are symmetric and normal bilaterally.   Plantar responses are flexor.    DIAGNOSTIC DATA (LABS, IMAGING, TESTING) - I reviewed patient records, labs, notes, testing and imaging myself where available.  Lab Results  Component Value Date   WBC 8.2 05/01/2014   HGB 12.5 05/01/2014   HCT 37.8 05/01/2014   MCV 85.0 05/01/2014   PLT 377 05/01/2014      Component Value Date/Time   NA 141 05/01/2014 1605   NA 139 05/31/2011 0912   K 3.9 05/01/2014 1605   K 4.0 05/31/2011 0912   CL 107 04/03/2012 0847   CL 105 05/31/2011 0912   CO2 23  05/01/2014 1605   CO2 24 05/31/2011 0912   GLUCOSE 97 05/01/2014 1605   GLUCOSE 98 04/03/2012 0847   GLUCOSE 96 05/31/2011 0912   BUN 10.4 05/01/2014 1605   BUN 12 05/31/2011 0912   CREATININE 0.7 05/01/2014 1605   CREATININE 0.71 05/31/2011 0912   CALCIUM 8.8 05/01/2014 1605   CALCIUM 8.8 05/31/2011 0912   PROT 6.6 05/01/2014 1605   PROT 6.3 05/31/2011 0912   ALBUMIN 3.6 05/01/2014 1605   ALBUMIN 3.8 05/31/2011 0912   AST 28 05/01/2014 1605   AST 12 05/31/2011 0912   ALT 32 05/01/2014 1605   ALT 13 05/31/2011 0912   ALKPHOS 28* 05/01/2014 1605   ALKPHOS 25* 05/31/2011 0912   BILITOT 0.38 05/01/2014 1605   BILITOT 0.4 05/31/2011 0912      ASSESSMENT AND PLAN  Multiple sclerosis - Plan: MR Brain W Wo Contrast  Dysesthesia - Plan: MR Brain W Wo Contrast  Insomnia  Hx Breast Cancer (DCIS) Right, Stage 0, receptor +     Daphnie Venturini is a 44 year old woman with relapsing remitting MS who has done very well off medication for the past 6 years. Her last MRI was more than 2 years ago and we need to check another MRI of the brain with and without contrast to make sure that there is not subclinical progression. If this is occurring we would need to reconsider treatment with a disease modifying therapy. If no new lesions are noted we will continue to have her stay off a disease modifying therapy but check an MRI every 2 years or so. Additionally, she knows to call us if she has any symptoms that are worrisome for an exacerbation.  Her second problem is insomnia. This is probably due to a combination of her partner snoring and having dysesthesias. I prescribed gabapentin 600 mg. She will take half a pill for a few nights to see if that helps her sleep maintenance enough. She will go up to a full pill if she needs to. If ineffective I asked her to give me a call as there are many medications we can consider for sleep maintenance insomnia.  She will return to see [place] in one year or call  sooner if she has new or worsening neurologic symptoms.   Richard A. Felecia Shelling, MD, PhD 04/10/8248, 0:37 AM Certified in Neurology, Clinical Neurophysiology, Sleep Medicine, Pain Medicine and Neuroimaging  [facility] [address], [place], [place] [address] [phone]

## 2014-07-10 ENCOUNTER — Ambulatory Visit (INDEPENDENT_AMBULATORY_CARE_PROVIDER_SITE_OTHER): Payer: BLUE CROSS/BLUE SHIELD

## 2014-07-10 DIAGNOSIS — R208 Other disturbances of skin sensation: Secondary | ICD-10-CM | POA: Diagnosis not present

## 2014-07-10 DIAGNOSIS — G35 Multiple sclerosis: Secondary | ICD-10-CM

## 2014-07-10 MED ORDER — GADOPENTETATE DIMEGLUMINE 469.01 MG/ML IV SOLN: 20.0000 mL | Freq: Once | INTRAVENOUS | Status: AC | PRN

## 2014-07-12 ENCOUNTER — Telehealth: Payer: Self-pay | Admitting: *Deleted

## 2014-07-12 NOTE — Telephone Encounter (Signed)
-----   Message from Britt Bottom, MD sent at 07/11/2014  5:32 PM EDT ----- Please let her know I looked at her MRI and compared it to the MRI from 2013 that she had a [facility]. The MRI just shows a couple of very small spots in the brain and there is no definite change when compared to 2013. Therefore we don't have to do anything different at this time and we'll check another MRI in 2 years or so.

## 2014-07-12 NOTE — Telephone Encounter (Signed)
I have spoken with Traci Watkins this morning and verified that per RAS, he saw no definite changes in the recent and 2013 mri's. She verbalized understanding of same/fim

## 2014-07-12 NOTE — Telephone Encounter (Signed)
LMOM (identified vm) that per RAS,  mri shows no def change when compared to 2013 mri, that she should continue with no tx. changes, and will repeat mri in about 2 yrs.  She does not need to return this call unless she has questions./fim

## 2014-07-12 NOTE — Telephone Encounter (Signed)
Patient is returning a call. She did receive the message from today but she does have questions.

## 2014-09-09 ENCOUNTER — Telehealth: Payer: Self-pay | Admitting: Neurology

## 2014-09-09 ENCOUNTER — Ambulatory Visit (INDEPENDENT_AMBULATORY_CARE_PROVIDER_SITE_OTHER): Payer: BLUE CROSS/BLUE SHIELD | Admitting: Neurology

## 2014-09-09 ENCOUNTER — Encounter: Payer: Self-pay | Admitting: Neurology

## 2014-09-09 VITALS — BP 140/74 | HR 72 | Resp 14 | Ht 68.0 in | Wt 206.0 lb

## 2014-09-09 DIAGNOSIS — R42 Dizziness and giddiness: Secondary | ICD-10-CM

## 2014-09-09 DIAGNOSIS — C801 Malignant (primary) neoplasm, unspecified: Secondary | ICD-10-CM | POA: Insufficient documentation

## 2014-09-09 DIAGNOSIS — G35 Multiple sclerosis: Secondary | ICD-10-CM | POA: Diagnosis not present

## 2014-09-09 DIAGNOSIS — R208 Other disturbances of skin sensation: Secondary | ICD-10-CM | POA: Diagnosis not present

## 2014-09-09 DIAGNOSIS — Z853 Personal history of malignant neoplasm of breast: Secondary | ICD-10-CM

## 2014-09-09 DIAGNOSIS — G4700 Insomnia, unspecified: Secondary | ICD-10-CM | POA: Diagnosis not present

## 2014-09-09 NOTE — Progress Notes (Signed)
[facility]  PATIENT: Traci Watkins DOB: 10-26-1970  REFERRING DOCTOR OR PCP:  Carlos Levering [facility]) SOURCE: patient and EMR records  _________________________________   HISTORICAL  CHIEF COMPLAINT:  Chief Complaint  Patient presents with  . Multiple Sclerosis    Sts. she has had more left leg weakness, more when she is feeling run down.  More left arm weakness and dizziness onset this am/fim    HISTORY OF PRESENT ILLNESS:  Traci Watkins is a 44 year old woman with relapsing remitting multiple sclerosis.    Last night, her left leg felt mildly weak but she was not overly concerned as she was outdoors in the heat all day.   This morning, upon waking up, she noted dizziness (lightheadedness).   She also noted more changes on the left.   Her left arm feels slightly numb.   She feels the left leg is mildly weak and feels a little tingly.   She felt off balanced this morning but that and the dizziness are both better now.  However, the left sided symptoms are still present.    Gait/strength/sensation: She has no difficulty with her gait.   Before this week, she had complained of some left leg dysesthesias.      Bowel/bladder: She denies any difficulty with bladder or bowel function.  Vision: She denies any MS related visual problems. She has never had optic neuritis or diplopia. There is no eye pain.  Fatigue/sleep. She denies any significant difficulty with fatigue during the daytime. She has some difficulty with insomnia,sleep maintenance > sleep onset issues.    Her bed partner snores loudly.  Mood/cognition: She denies any depression.  However, this current episode is causing her to be anxious and she became a little tearful.   She has not noted any cognitive issues at work or at home.  MS history: In 2000 presented with right facial numbness and later that year had left leg numbness. An MRI and lumbar puncture were performed. She was diagnosed with probable  multiple sclerosis. One year later and another MRI was performed that showed additional lesions. She then was diagnosed with clinically definite multiple sclerosis. She was placed on the Betaseron she stopped Betaseron around 2009 due to side effects of the medication. Clinically, she had done well with no exacerbations.   At that time, she had had several stable MRIs during the interim additionally, she has had no further clinical relapses. We made an agreement that she will continue to get occasional MRI monitoring to make sure that there was not subclinical progression of her disease. He rlast MRI was Fortunately, several more MRIs over the last 6 years have shown no further disease activity. Additionally, she has had no clinical relapses and no progression of disability.  Other:  Breast cancer:  She has ductal carcinoma in situ breast cancer she was treated with a lumpectomy, radiation and placed on tamoxifen. She is to receive tamoxifen for 1 more year. She has had no further disease activity for her breast cancer.    REVIEW OF SYSTEMS: Constitutional: No fevers, chills, sweats, or change in appetite Eyes: No visual changes, double vision, eye pain Ear, nose and throat: No hearing loss, ear pain, nasal congestion, sore throat Cardiovascular: No chest pain, palpitations Respiratory: No shortness of breath at rest or with exertion.   No wheezes GastrointestinaI: No nausea, vomiting, diarrhea, abdominal pain, fecal incontinence Genitourinary: No dysuria, urinary retention or frequency.  No nocturia. Musculoskeletal: No neck pain, back pain Integumentary: No  rash, pruritus, skin lesions Neurological: as above Psychiatric: No depression at this time.  Some anxiety Endocrine: No palpitations, diaphoresis, change in appetite, change in weigh or increased thirst Hematologic/Lymphatic: No anemia, purpura, petechiae. Allergic/Immunologic: No itchy/runny eyes, nasal congestion, recent allergic  reactions, rashes  ALLERGIES: No Known Allergies  HOME MEDICATIONS:  Current outpatient prescriptions:  .  ibuprofen (ADVIL,MOTRIN) 800 MG tablet, Take 800 mg by mouth every 8 (eight) hours as needed., Disp: , Rfl:  .  Multiple Vitamin (MULTIVITAMIN) tablet, Take 1 tablet by mouth daily., Disp: , Rfl:  .  PARAGARD INTRAUTERINE COPPER IUD IUD, 1 each by Intrauterine route once., Disp: , Rfl:  .  tamoxifen (NOLVADEX) 20 MG tablet, Take 1 tablet (20 mg total) by mouth daily., Disp: 90 tablet, Rfl: 3 .  gabapentin (NEURONTIN) 600 MG tablet, Take 1 tablet (600 mg total) by mouth at bedtime. (Patient not taking: Reported on 09/09/2014), Disp: 30 tablet, Rfl: 11  PAST MEDICAL HISTORY: Past Medical History  Diagnosis Date  . MS (multiple sclerosis)   . Breast cancer 03/24/10    /04/29/10 lumpectomt dcis  . Multiple sclerosis     10 years,tx with interferon  . Anemia     during pregnancy  . Anxiety     PAST SURGICAL HISTORY: Past Surgical History  Procedure Laterality Date  . Laparoscopic cholecystectomy  07/26/08  . Breast lumpectomy  04/29/10    right    FAMILY HISTORY: Family History  Problem Relation Age of Onset  . Cancer Father     bladder  . Aneurysm Father     SOCIAL HISTORY:  History   Social History  . Marital Status: Married    Spouse Name: N/A  . Number of Children: 1  . Years of Education: N/A   Occupational History  . Barrister's clerk    Social History Main Topics  . Smoking status: Former Smoker -- 0.50 packs/day    Types: Cigarettes  . Smokeless tobacco: Never Used  . Alcohol Use: Yes     Comment: socially  . Drug Use: No     Comment: 10 years,quit 2008  . Sexual Activity: Not on file   Other Topics Concern  . Not on file   Social History Narrative     PHYSICAL EXAM  Filed Vitals:   09/09/14 1317  BP: 140/74  Pulse: 72  Resp: 14  Height: 5\' 8"  (1.727 m)  Weight: 206 lb (93.441 kg)    Body mass index is 31.33  kg/(m^2).   General: The patient is well-developed and well-nourished and in no acute distress  Eyes:  Funduscopic exam shows normal optic discs and retinal vessels.  Neck: The neck is supple, no carotid bruits are noted.  The neck is nontender.  Cardiovascular: The heart has a regular rate and rhythm with a normal S1 and S2. There were no murmurs, gallops or rubs.   Skin: Extremities are without significant edema.  Musculoskeletal:  Back is nontender  Neurologic Exam  Mental status: The patient is alert and oriented x 3 at the time of the examination. The patient has apparent normal recent and remote memory, with an apparently normal attention span and concentration ability.   Speech is normal.  Cranial nerves: Extraocular movements are full. Pupils are equal, round, and reactive to light and accomodation.  Facial symmetry is present. There is slight reduced left facial sensation to temperature (symm touch). Facial strength is normal.  Trapezius and sternocleidomastoid strength is normal. No dysarthria is noted.  The tongue is midline, and the patient has symmetric elevation of the soft palate. No obvious hearing deficits are noted.  Motor:  Muscle bulk is normal.   Tone is normal. Strength is  5 / 5 in all 4 extremities.   Sensory: Sensory testing shows mild decreased touch and vibration sensation in he left arm and leg.   Coordination: Cerebellar testing reveals good finger-nose-finger and heel-to-shin bilaterally.  Gait and station: Station is normal.   Gait is normal. Tandem gait is very slightly wide. Romberg is negative.   Reflexes: Deep tendon reflexes are symmetric and normal in arms but left knee DTR mildly increased compared to the right.   Plantar responses are flexor.    DIAGNOSTIC DATA (LABS, IMAGING, TESTING) - I reviewed patient records, labs, notes, testing and imaging myself where available.  Lab Results  Component Value Date   WBC 8.2 05/01/2014   HGB 12.5  05/01/2014   HCT 37.8 05/01/2014   MCV 85.0 05/01/2014   PLT 377 05/01/2014      Component Value Date/Time   NA 141 05/01/2014 1605   NA 139 05/31/2011 0912   K 3.9 05/01/2014 1605   K 4.0 05/31/2011 0912   CL 107 04/03/2012 0847   CL 105 05/31/2011 0912   CO2 23 05/01/2014 1605   CO2 24 05/31/2011 0912   GLUCOSE 97 05/01/2014 1605   GLUCOSE 98 04/03/2012 0847   GLUCOSE 96 05/31/2011 0912   BUN 10.4 05/01/2014 1605   BUN 12 05/31/2011 0912   CREATININE 0.7 05/01/2014 1605   CREATININE 0.71 05/31/2011 0912   CALCIUM 8.8 05/01/2014 1605   CALCIUM 8.8 05/31/2011 0912   PROT 6.6 05/01/2014 1605   PROT 6.3 05/31/2011 0912   ALBUMIN 3.6 05/01/2014 1605   ALBUMIN 3.8 05/31/2011 0912   AST 28 05/01/2014 1605   AST 12 05/31/2011 0912   ALT 32 05/01/2014 1605   ALT 13 05/31/2011 0912   ALKPHOS 28* 05/01/2014 1605   ALKPHOS 25* 05/31/2011 0912   BILITOT 0.38 05/01/2014 1605   BILITOT 0.4 05/31/2011 0912      ASSESSMENT AND PLAN  Multiple sclerosis  Dysesthesia  Hx Breast Cancer (DCIS) Right, Stage 0, receptor +  Insomnia  Dizziness and giddiness   1.  I will have her get 1 g of IV Solu-Medrol today and consider an additional 2 days if she is not better tomorrow. 2.  Additionally, not better tomorrow I'll consider obtaining another MRI of the brain. At this point, I am not certain that she is having an exacerbation. However, does persist that likelihood is much higher and I would want her to restart medication. Additionally, there are changes on the MRI then I would also want her to restart medication. 3.  She will give [place] a call in the morning to let us know how she is doing and I'll see her back in a few months if she is better or sooner if she is having more symptoms.    Moneisha Vosler A. Felecia Shelling, MD, PhD 1/44/8185, 6:31 PM Certified in Neurology, Clinical Neurophysiology, Sleep Medicine, Pain Medicine and Neuroimaging  [facility] [address], [place], [place] [address] [phone]

## 2014-09-09 NOTE — Telephone Encounter (Signed)
Pt called and would like to speak with the RN about symptoms : numbness in lower left leg and dizzy. Please call and advise [phone]

## 2014-09-09 NOTE — Telephone Encounter (Signed)
I have spoken with Traci Watkins this morning.  She c/o new left leg weakness, sts. feels "off", dizzy.  She is not on any MS therapy.  MRI brain was just checked in May and was ok.  Appt. given to see RAS this afternoon at 1pm/fim

## 2014-09-10 NOTE — Telephone Encounter (Signed)
I have spoken with Traci Watkins this morning--she sts. numbness is about 50% improved--still has some numbness in left side of face, left fingers, legs.  Per RAS, ok for 2nd day of iv solumedrol.  She is agreeable and will come in now.  [name] aware and can accomodate infusion/fim

## 2014-09-10 NOTE — Telephone Encounter (Signed)
Patient called and requested to speak with Faith RN. No details were given. Please call and advise.

## 2014-09-11 ENCOUNTER — Telehealth: Payer: Self-pay | Admitting: Neurology

## 2014-09-11 DIAGNOSIS — R200 Anesthesia of skin: Secondary | ICD-10-CM

## 2014-09-11 DIAGNOSIS — G35 Multiple sclerosis: Secondary | ICD-10-CM

## 2014-09-11 NOTE — Telephone Encounter (Signed)
I have spoken with [name] and per RAS, advised that given new sx, he would like to repeat mri brain, and also do mri c-spine this time.  Also, he would like for her to come in for a 3rd day of IV Solumedrol.  She is agreeable with this plan, will come in at 1pm today for IV.  [name] is aware/fim

## 2014-09-11 NOTE — Telephone Encounter (Signed)
Pt called and has some symptoms and would like to speak with the nurse about them. She just had infusion on Monday and Tuesday. Please call and advise [phone]

## 2014-09-11 NOTE — Telephone Encounter (Signed)
I have spoken with Traci Watkins this morning.  She sts. left sided facial, left arm and left leg numbness are some worse than yesterday--about the same as Monday.  She has had 2 days of IV SoluMedrol.  Sts. she is a little anxious, fatigued.  Will check with RAS and call her back/fim

## 2014-09-13 DIAGNOSIS — G35 Multiple sclerosis: Secondary | ICD-10-CM | POA: Diagnosis not present

## 2014-09-16 ENCOUNTER — Ambulatory Visit (INDEPENDENT_AMBULATORY_CARE_PROVIDER_SITE_OTHER): Payer: Self-pay

## 2014-09-16 ENCOUNTER — Telehealth: Payer: Self-pay | Admitting: Neurology

## 2014-09-16 ENCOUNTER — Ambulatory Visit (INDEPENDENT_AMBULATORY_CARE_PROVIDER_SITE_OTHER): Payer: BLUE CROSS/BLUE SHIELD

## 2014-09-16 DIAGNOSIS — R200 Anesthesia of skin: Secondary | ICD-10-CM

## 2014-09-16 DIAGNOSIS — Z0289 Encounter for other administrative examinations: Secondary | ICD-10-CM

## 2014-09-16 DIAGNOSIS — G35 Multiple sclerosis: Secondary | ICD-10-CM

## 2014-09-16 NOTE — Telephone Encounter (Signed)
MRI of the brain and spinal cord did not show any new lesions.  Are her symptoms getting better?

## 2014-09-16 NOTE — Telephone Encounter (Signed)
Pt called back to get results of MRI. States she is leaving on Thursday and wants to talk with someone before she leaves. Please call and advise [phone]

## 2014-09-16 NOTE — Telephone Encounter (Signed)
Left VM for pt to let her know I spoke w/ Dr. Felecia Shelling and MRI results are not back yet. Told her I will call her later on today or tomorrow w/ results. Gave [facility] phone number.

## 2014-09-16 NOTE — Telephone Encounter (Signed)
Pt called regarding results from MRI 09/13/14

## 2014-09-17 NOTE — Telephone Encounter (Signed)
There is nothing we need to do right now. If she wants to reconsider and start a medication for the MS we can see her when she gets back in town.

## 2014-09-17 NOTE — Telephone Encounter (Signed)
Spoke w/ Traci Watkins. She is feeling better than last week. Her numbness is not any worse, but it is not like last week. She was slightly dizzy last night but nothing compared to last week. Her face is "okay" but she says she knows it is not normal. She is not sure if her episode last week was a flare-up or heat related..ect. She got Dr. Garth Bigness VM and is aware MRI brain and spinal cord showed no new lesions. She is going out of town to [place] for vacation on Friday. If she needs to do anything else, she would like to try and do it before she leaves. I told her I will let Dr. Felecia Shelling know. She verbalized understanding.

## 2014-09-17 NOTE — Telephone Encounter (Signed)
Pt called and I relayed what was noted about her MRI results , per tele not left by Dr. Felecia Shelling. Asked if her symptoms were better and she states , "yes and No". She would like a call back from the nurse. May call pt at [phone]. Thank you

## 2014-12-16 ENCOUNTER — Other Ambulatory Visit: Payer: Self-pay | Admitting: Oncology

## 2014-12-16 DIAGNOSIS — Z9889 Other specified postprocedural states: Secondary | ICD-10-CM

## 2015-01-15 ENCOUNTER — Other Ambulatory Visit: Payer: Self-pay | Admitting: Oncology

## 2015-01-15 ENCOUNTER — Other Ambulatory Visit: Payer: Self-pay

## 2015-01-15 DIAGNOSIS — Z9889 Other specified postprocedural states: Secondary | ICD-10-CM

## 2015-01-17 ENCOUNTER — Ambulatory Visit
Admission: RE | Admit: 2015-01-17 | Discharge: 2015-01-17 | Disposition: A | Discharge status: Home / Self Care | Payer: BLUE CROSS/BLUE SHIELD | Source: Ambulatory Visit | Attending: Oncology | Admitting: Oncology

## 2015-01-17 DIAGNOSIS — Z9889 Other specified postprocedural states: Secondary | ICD-10-CM

## 2015-05-14 ENCOUNTER — Other Ambulatory Visit (HOSPITAL_BASED_OUTPATIENT_CLINIC_OR_DEPARTMENT_OTHER): Payer: BLUE CROSS/BLUE SHIELD

## 2015-05-14 ENCOUNTER — Ambulatory Visit (HOSPITAL_BASED_OUTPATIENT_CLINIC_OR_DEPARTMENT_OTHER): Payer: BLUE CROSS/BLUE SHIELD | Admitting: Oncology

## 2015-05-14 VITALS — BP 136/80 | HR 81 | Temp 98.2°F | Resp 18 | Wt 202.7 lb

## 2015-05-14 DIAGNOSIS — Z853 Personal history of malignant neoplasm of breast: Secondary | ICD-10-CM

## 2015-05-14 DIAGNOSIS — G35 Multiple sclerosis: Secondary | ICD-10-CM

## 2015-05-14 DIAGNOSIS — D0511 Intraductal carcinoma in situ of right breast: Secondary | ICD-10-CM | POA: Diagnosis not present

## 2015-05-14 DIAGNOSIS — C50911 Malignant neoplasm of unspecified site of right female breast: Secondary | ICD-10-CM

## 2015-05-14 LAB — COMPREHENSIVE METABOLIC PANEL
ALBUMIN: 3.8 g/dL (ref 3.5–5.0)
ALK PHOS: 29 U/L — AB (ref 40–150)
ALT: 15 U/L (ref 0–55)
AST: 14 U/L (ref 5–34)
Anion Gap: 7 mEq/L (ref 3–11)
BUN: 10.7 mg/dL (ref 7.0–26.0)
CALCIUM: 9.3 mg/dL (ref 8.4–10.4)
CO2: 26 mEq/L (ref 22–29)
CREATININE: 0.8 mg/dL (ref 0.6–1.1)
Chloride: 107 mEq/L (ref 98–109)
EGFR: 88 mL/min/{1.73_m2} — ABNORMAL LOW (ref >90)
Glucose: 114 mg/dl (ref 70–140)
POTASSIUM: 4.1 meq/L (ref 3.5–5.1)
Sodium: 140 mEq/L (ref 136–145)
Total Bilirubin: 0.39 mg/dL (ref 0.20–1.20)
Total Protein: 6.9 g/dL (ref 6.4–8.3)

## 2015-05-14 LAB — CBC WITH DIFFERENTIAL/PLATELET
BASO%: 0.6 % (ref 0.0–2.0)
BASOS ABS: 0.1 10*3/uL (ref 0.0–0.1)
EOS ABS: 0.1 10*3/uL (ref 0.0–0.5)
EOS%: 0.8 % (ref 0.0–7.0)
HCT: 38.7 % (ref 34.8–46.6)
HGB: 12.9 g/dL (ref 11.6–15.9)
LYMPH%: 22.9 % (ref 14.0–49.7)
MCH: 28.3 pg (ref 25.1–34.0)
MCHC: 33.3 g/dL (ref 31.5–36.0)
MCV: 84.9 fL (ref 79.5–101.0)
MONO#: 0.7 10*3/uL (ref 0.1–0.9)
MONO%: 7.3 % (ref 0.0–14.0)
NEUT#: 6.5 10*3/uL (ref 1.5–6.5)
NEUT%: 68.4 % (ref 38.4–76.8)
Platelets: 284 10*3/uL (ref 145–400)
RBC: 4.56 10*6/uL (ref 3.70–5.45)
RDW: 13 % (ref 11.2–14.5)
WBC: 9.6 10*3/uL (ref 3.9–10.3)
lymph#: 2.2 10*3/uL (ref 0.9–3.3)

## 2015-05-14 NOTE — Progress Notes (Signed)
This Dr. Jana Hakim ID: [name]   DOB: Jul 18, 1970  MR#: [id number]  GXQ#:[id number]  PCP: Wynelle Fanny GYN: Bradly Bienenstock, MD SU: Osborn Coho, MD OTHER MD: Arlice Colt, MD  CHIEF COMPLAINT:  Ductal carcinoma in situ  CURRENT TREATMENT: Tamoxifen, transitioning to observation  BREAST CANCER HISTORY: From the original intake note:   Traci Watkins had her first mammogram ever January 2012.  This screening study showed a possible mass in the right breast.  She was recalled for additional views and diagnostic mammography on January 18 showed a small cluster of microcalcifications laterally and inferior in the right breast.  They were considered the slightly suspicious.  On physical exam there were small palpable mobile masses in the right breast at the 12 o'clock position.  However, ultrasound showed these to be simple cysts.   On February 7 Dr. 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  The patient was then referred to Dr. Margot Chimes, and after appropriate discussion on March 14 the patient underwent needle-guided excision of the area in question.  This showed ([id number]) ductal carcinoma in situ, low grade, spanning approximately 0.5 cm, focally within 1 mm of the posterior margin.  The tumor was estrogen receptor positive at 94% and progesterone receptor positive at 100%.   Dr. Margot Chimes and Dr. Tammi Klippel discussed the margin and issue and their feeling was that the patient's risk of recurrence was sufficiently low with the addition of radiation that the margin clearance would not make a significant contribution.  The patient then proceeded to radiation under Dr. Tammi Klippel and this was completed June 6.   She began on tamoxifen in September 2012, 20 mg daily with good tolerance.  Subsequent history is as noted below.  INTERVAL HISTORY: Traci Watkins returns today for follow-up of her estrogen  receptor positive breast cancer. She continues on tamoxifen, with excellent tolerance. Hot flashes are rare. She obtains a drug at no cost.  REVIEW OF SYSTEMS: She is working very hard, which in a way as good, but does not give her a lot of free time. She started regular exercise program about 3 weeks ago, doing 30 minutes on the treadmill, 4 days a week minimum. She has had no problems in terms of injuries. She is concerned about her weight. Her periods are now irregular. There are little heavier than before. She has a copper IUD in place. Aside from these issues a detailed review of systems today was noncontributory  PAST MEDICAL HISTORY: Past Medical History  Diagnosis Date  . MS (multiple sclerosis)   . Breast cancer 03/24/10    /04/29/10 lumpectomt dcis  . Multiple sclerosis     10 years,tx with interferon  . Anemia     during pregnancy  . Anxiety     PAST SURGICAL HISTORY: Past Surgical History  Procedure Laterality Date  . Laparoscopic cholecystectomy  07/26/08  . Breast lumpectomy  04/29/10    right    FAMILY HISTORY Family History  Problem Relation Age of Onset  . Cancer Father     bladder  . Aneurysm Father    GYN HISTORY:  (Updated 04/18/2013) Menarche age 23, first pregnancy to term at age 37.  The patient is Gx P1. She still having irregular periods at the time of her breast cancer diagnosis. Status post insertion of Paragard IUD in May 2014.   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  She was married for 20 years but is currently undergoing a divorce. At home is just she and 2 dogs. Her daughter attends [employer].  The patient is not a church attender.   ADVANCED DIRECTIVES: Not in place  HEALTH MAINTENANCE:  (updated 04/18/2013) Social History  Substance Use Topics  . Smoking status: Former Smoker -- 0.50 packs/day    Types: Cigarettes  . Smokeless tobacco: Never Used  . Alcohol Use: Yes     Comment:  socially     Colonoscopy: never  PAP: UTD/Dr. Suzzette Righter  Bone density: Never  Lipid panel: Not on file  No Known Allergies  Current Outpatient Prescriptions  Medication Sig Dispense Refill  . gabapentin (NEURONTIN) 600 MG tablet Take 1 tablet (600 mg total) by mouth at bedtime. (Patient not taking: Reported on 09/09/2014) 30 tablet 11  . ibuprofen (ADVIL,MOTRIN) 800 MG tablet Take 800 mg by mouth every 8 (eight) hours as needed.    . Multiple Vitamin (MULTIVITAMIN) tablet Take 1 tablet by mouth daily.    Marland Kitchen PARAGARD INTRAUTERINE COPPER IUD IUD 1 each by Intrauterine route once.    . tamoxifen (NOLVADEX) 20 MG tablet Take 1 tablet (20 mg total) by mouth daily. 90 tablet 3   No current facility-administered medications for this visit.    OBJECTIVE: Young white woman Who appears well Filed Vitals:   05/14/15 1545  BP: 136/80  Pulse: 81  Temp: 98.2 F (36.8 C)  Resp: 18     Body mass index is 30.83 kg/(m^2).    ECOG FS: 0 Filed Weights   05/14/15 1545  Weight: 202 lb 11.2 oz (91.944 kg)   Sclerae unicteric, pupils round and equal Oropharynx clear and moist-- no thrush or other lesions No cervical or supraclavicular adenopathy Lungs no rales or rhonchi Heart regular rate and rhythm Abd soft, nontender, positive bowel sounds MSK no focal spinal tenderness, no upper extremity lymphedema Neuro: nonfocal, well oriented, appropriate affect Breasts: The right breast is status post lumpectomy and radiation. There is no evidence of local recurrence. The right axilla is benign. The left breast is unremarkable'   LAB RESULTS: Lab Results  Component Value Date   WBC 9.6 05/14/2015   NEUTROABS 6.5 05/14/2015   HGB 12.9 05/14/2015   HCT 38.7 05/14/2015   MCV 84.9 05/14/2015   PLT 284 05/14/2015      Chemistry      Component Value Date/Time   NA 141 05/01/2014 1605   NA 139 05/31/2011 0912   K 3.9 05/01/2014 1605   K 4.0 05/31/2011 0912   CL 107 04/03/2012 0847   CL 105  05/31/2011 0912   CO2 23 05/01/2014 1605   CO2 24 05/31/2011 0912   BUN 10.4 05/01/2014 1605   BUN 12 05/31/2011 0912   CREATININE 0.7 05/01/2014 1605   CREATININE 0.71 05/31/2011 0912      Component Value Date/Time   CALCIUM 8.8 05/01/2014 1605   CALCIUM 8.8 05/31/2011 0912   ALKPHOS 28* 05/01/2014 1605   ALKPHOS 25* 05/31/2011 0912   AST 28 05/01/2014 1605   AST 12 05/31/2011 0912   ALT 32 05/01/2014 1605   ALT 13 05/31/2011 0912   BILITOT 0.38 05/01/2014 1605   BILITOT 0.4 05/31/2011 0912       STUDIES: CLINICAL DATA: Annual diagnostic mammogram in this patient to had a right lumpectomy for breast carcinoma in 2012 treated with adjuvant radiation therapy. Patient is on tamoxifen. No current complaints.  EXAM: DIGITAL  DIAGNOSTIC BILATERAL MAMMOGRAM WITH 3D TOMOSYNTHESIS AND CAD  COMPARISON: Previous exam(s).  ACR Breast Density Category b: There are scattered areas of fibroglandular density.  FINDINGS: Subtle postoperative scarring on the right is stable. There are no other areas of architectural distortion. There are no discrete masses. There are no areas of significant or new asymmetry. There are no suspicious calcifications.  Mammographic images were processed with CAD.  IMPRESSION: No evidence of recurrent or new breast malignancy. Benign postsurgical changes on the right  RECOMMENDATION: Diagnostic mammography in 1 year per standard post lumpectomy protocol.  I have discussed the findings and recommendations with the patient. Results were also provided in writing at the conclusion of the visit. If applicable, a reminder letter will be sent to the patient regarding the next appointment.  BI-RADS CATEGORY 2: Benign.   Electronically Signed  By: Lajean Manes M.D.  On: 01/17/2015 16:05       ASSESSMENT: A 45 y.o.  BRCA negative [place] woman originally from [place]   (1)  status post right lumpectomy March 2012 for  ductal carcinoma in situ, low grade measuring 5 mm with some evidence of necrosis and a focally close but technically negative posterior margin.  The tumor was strongly estrogen and progesterone receptor positive.    (2)  She completed radiation in June 2012.   (3)   she began on tamoxifen in September 2012, the plan being to continue for total of 5 years (until September 2017).  (4)  Multiple Sclerosis, diagnosed in 2000  (5)  status post placement of ParaGard IUD May 2014 with subsequent menorrhagia  PLAN: Traci Watkins will complete 5 years of tamoxifen August of this year. At this point I am comfortable releasing her to her primary care physician.  We reviewed the fact that she had a noninvasive breast cancer, she is not life-threatening itself. By taking anti-estrogens for 5 years she has reduce the risk of this cancer recurring by one half. She has also reduce the risk of a new breast cancer developing in either breast.  We also discussed diet issues today. She has a reasonable weight lose goal, and understands that reducing calories as the main form of weight control. She certainly needs to continue her exercise program otherwise it would be little hope of her losing any weight. In addition of course there is a fitness issue.  Finally she is now officially divorced and so does not have a healthcare part of attorney. I gave her the appropriate healthcare part of attorney forms to complete and notarize.  As far as breast cancer follow-up is concerned on she needs is yearly mammography, preferably with tomography, and a yearly physician breast exam.  I will be glad to see Traci Watkins at anytime in the future if the need arises, but as of now we have been no further routine appointments for her here.  Traci Cruel, MD    05/14/2015

## 2015-07-03 ENCOUNTER — Ambulatory Visit (INDEPENDENT_AMBULATORY_CARE_PROVIDER_SITE_OTHER): Payer: BLUE CROSS/BLUE SHIELD | Admitting: Neurology

## 2015-07-03 ENCOUNTER — Encounter: Payer: Self-pay | Admitting: Neurology

## 2015-07-03 DIAGNOSIS — G35 Multiple sclerosis: Secondary | ICD-10-CM | POA: Diagnosis not present

## 2015-07-03 DIAGNOSIS — E559 Vitamin D deficiency, unspecified: Secondary | ICD-10-CM | POA: Diagnosis not present

## 2015-07-03 DIAGNOSIS — G4700 Insomnia, unspecified: Secondary | ICD-10-CM | POA: Diagnosis not present

## 2015-07-03 DIAGNOSIS — R208 Other disturbances of skin sensation: Secondary | ICD-10-CM

## 2015-07-03 DIAGNOSIS — C801 Malignant (primary) neoplasm, unspecified: Secondary | ICD-10-CM | POA: Insufficient documentation

## 2015-07-03 DIAGNOSIS — C50911 Malignant neoplasm of unspecified site of right female breast: Secondary | ICD-10-CM

## 2015-07-03 NOTE — Progress Notes (Signed)
[facility]  PATIENT: Traci Watkins DOB: Dec 26, 1970  REFERRING DOCTOR OR PCP:  Carlos Levering [facility]) SOURCE: patient and EMR records  _________________________________   HISTORICAL  CHIEF COMPLAINT:  Chief Complaint  Patient presents with  . Multiple Sclerosis    Sts. she has done well since the one exacerbation of sx. last summer.  Denies new or worsening sx./fim    HISTORY OF PRESENT ILLNESS:  Traci Watkins is a 45 year old woman with relapsing remitting multiple sclerosis.       She has been off medication x many years.     She has had only one exacerbation in the past years --- a small episode of left leg weakness in July 2016.    I reviewed the MRI of the brain and MRI of the cervical spine from 09/16/2015. The MRI of the brain was unchanged from the previous one with a low number of periventricular and deep white matter foci. The MRI of the cervical spine did not show any MS plaques.  Gait/strength/sensation: She has no difficulty with her gait. Every now and then, the left leg seems slightly weak or clumsy but the vast majority of the time is fine.    She has some left leg dysesthesias and it feels different than the right     Bowel/bladder: She denies any difficulty with bladder or bowel function.  Vision: She denies any MS related visual problems. She has never had optic neuritis or diplopia. There is no eye pain.  Fatigue/sleep. She denies any significant difficulty with fatigue during the daytime. She has some insomnia,sleep maintenance > sleep onset issues.    She only has a bad night once a week.     Mood/cognition: She denies any depression.   She has not noted any cognitive issues at work or at home.   She works as a Government social research officer.     Vit D deficiency:  Many years ago, she took vitamin D supplements when she was found to have a mild deficiency. She has not been checked a vitamin D many years.  MS history: In 2000 presented with right facial  numbness and later that year had left leg numbness. An MRI and lumbar puncture were performed. She was diagnosed with probable multiple sclerosis. One year later and another MRI was performed that showed additional lesions. She then was diagnosed with clinically definite multiple sclerosis. She was placed on the Betaseron she stopped Betaseron around 2009 due to side effects of the medication. Clinically, she had done well with no exacerbations.   At that time, she had had several stable MRIs during the interim additionally, she has had no further clinical relapses. We made an agreement that she will continue to get occasional MRI monitoring to make sure that there was not subclinical progression of her disease. He rlast MRI was Fortunately, several more MRIs over the last 6 years have shown no further disease activity. Additionally, she has had no clinical relapses and no progression of disability.  Other:  Breast cancer:  She will be five years cancer free later in 2017 and will be able to go off tamoxifen.  She has ductal carcinoma in situ breast cancer she was treated with a lumpectomy, radiation and placed on tamoxifen. She is to receive tamoxifen for 1 more year. She has had no further disease activity for her breast cancer.    REVIEW OF SYSTEMS: Constitutional: No fevers, chills, sweats, or change in appetite Eyes: No visual changes, double vision, eye  pain Ear, nose and throat: No hearing loss, ear pain, nasal congestion, sore throat Cardiovascular: No chest pain, palpitations Respiratory: No shortness of breath at rest or with exertion.   No wheezes GastrointestinaI: No nausea, vomiting, diarrhea, abdominal pain, fecal incontinence Genitourinary: No dysuria, urinary retention or frequency.  No nocturia. Musculoskeletal: No neck pain, back pain Integumentary: No rash, pruritus, skin lesions Neurological: as above Psychiatric: No depression at this time.  Some anxiety Endocrine: No  palpitations, diaphoresis, change in appetite, change in weigh or increased thirst Hematologic/Lymphatic: No anemia, purpura, petechiae. Allergic/Immunologic: No itchy/runny eyes, nasal congestion, recent allergic reactions, rashes  ALLERGIES: No Known Allergies  HOME MEDICATIONS:  Current outpatient prescriptions:  .  ibuprofen (ADVIL,MOTRIN) 800 MG tablet, Take 800 mg by mouth every 8 (eight) hours as needed., Disp: , Rfl:  .  Multiple Vitamin (MULTIVITAMIN) tablet, Take 1 tablet by mouth daily., Disp: , Rfl:  .  PARAGARD INTRAUTERINE COPPER IUD IUD, 1 each by Intrauterine route once., Disp: , Rfl:  .  tamoxifen (NOLVADEX) 20 MG tablet, Take 1 tablet (20 mg total) by mouth daily., Disp: 90 tablet, Rfl: 3  PAST MEDICAL HISTORY: Past Medical History  Diagnosis Date  . MS (multiple sclerosis) ([place])   . Breast cancer ([place]) 03/24/10    /04/29/10 lumpectomt dcis  . Multiple sclerosis ([place])     10 years,tx with interferon  . Anemia     during pregnancy  . Anxiety     PAST SURGICAL HISTORY: Past Surgical History  Procedure Laterality Date  . Laparoscopic cholecystectomy  07/26/08  . Breast lumpectomy  04/29/10    right    FAMILY HISTORY: Family History  Problem Relation Age of Onset  . Cancer Father     bladder  . Aneurysm Father     SOCIAL HISTORY:  Social History   Social History  . Marital Status: Married    Spouse Name: N/A  . Number of Children: 1  . Years of Education: N/A   Occupational History  . Barrister's clerk    Social History Main Topics  . Smoking status: Former Smoker -- 0.50 packs/day    Types: Cigarettes  . Smokeless tobacco: Never Used  . Alcohol Use: Yes     Comment: socially  . Drug Use: No     Comment: 10 years,quit 2008  . Sexual Activity: Not on file   Other Topics Concern  . Not on file   Social History Narrative     PHYSICAL EXAM  There were no vitals filed for this visit.  There is no weight on file to calculate  BMI.   General: The patient is well-developed and well-nourished and in no acute distress  Eyes:  Funduscopic exam shows normal optic discs and retinal vessels.  Neck: The neck is supple, no carotid bruits are noted.  The neck is nontender.  Cardiovascular: The heart has a regular rate and rhythm with a normal S1 and S2. There were no murmurs, gallops or rubs.   Skin: Extremities are without significant edema.  Musculoskeletal:  Back is nontender  Neurologic Exam  Mental status: The patient is alert and oriented x 3 at the time of the examination. The patient has apparent normal recent and remote memory, with an apparently normal attention span and concentration ability.   Speech is normal.  Cranial nerves: Extraocular movements are full. Pupils are equal, round, and reactive to light and accomodation.  Facial symmetry is present. There is slight reduced left facial sensation to temperature (  symm touch). Facial strength is normal.  Trapezius and sternocleidomastoid strength is normal. No dysarthria is noted.  The tongue is midline, and the patient has symmetric elevation of the soft palate. No obvious hearing deficits are noted.  Motor:  Muscle bulk is normal.   Tone is normal. Strength is  5 / 5 in all 4 extremities.   Sensory: Sensory testing shows mild decreased touch and vibration sensation in her left foot, more noted on the great toe than small toe  Coordination: Cerebellar testing reveals good finger-nose-finger and heel-to-shin bilaterally.  Gait and station: Station is normal.   Gait is normal. Tandem gait is very slightly wide. Romberg is negative.   Reflexes: Deep tendon reflexes are symmetric and normal in arms but left knee DTR mildly increased compared to the right.   Plantar responses are flexor.    DIAGNOSTIC DATA (LABS, IMAGING, TESTING) - I reviewed patient records, labs, notes, testing and imaging myself where available.  Lab Results  Component Value Date    WBC 9.6 05/14/2015   HGB 12.9 05/14/2015   HCT 38.7 05/14/2015   MCV 84.9 05/14/2015   PLT 284 05/14/2015      Component Value Date/Time   NA 140 05/14/2015 1537   NA 139 05/31/2011 0912   K 4.1 05/14/2015 1537   K 4.0 05/31/2011 0912   CL 107 04/03/2012 0847   CL 105 05/31/2011 0912   CO2 26 05/14/2015 1537   CO2 24 05/31/2011 0912   GLUCOSE 114 05/14/2015 1537   GLUCOSE 98 04/03/2012 0847   GLUCOSE 96 05/31/2011 0912   BUN 10.7 05/14/2015 1537   BUN 12 05/31/2011 0912   CREATININE 0.8 05/14/2015 1537   CREATININE 0.71 05/31/2011 0912   CALCIUM 9.3 05/14/2015 1537   CALCIUM 8.8 05/31/2011 0912   PROT 6.9 05/14/2015 1537   PROT 6.3 05/31/2011 0912   ALBUMIN 3.8 05/14/2015 1537   ALBUMIN 3.8 05/31/2011 0912   AST 14 05/14/2015 1537   AST 12 05/31/2011 0912   ALT 15 05/14/2015 1537   ALT 13 05/31/2011 0912   ALKPHOS 29* 05/14/2015 1537   ALKPHOS 25* 05/31/2011 0912   BILITOT 0.39 05/14/2015 1537   BILITOT 0.4 05/31/2011 0912      ASSESSMENT AND PLAN  Multiple sclerosis (HCC) - Plan: VITAMIN D 25 Hydroxy (Vit-D Deficiency, Fractures)  Malignant neoplasm of right female breast, unspecified site of breast ([place])  Dysesthesia  Insomnia  Vitamin D deficiency - Plan: VITAMIN D 25 Hydroxy (Vit-D Deficiency, Fractures)    1.  She continues to do well off DMT medication and we will check another MRI of the brain next year to make sure that there has not been subclinical progression. She has a very low lesion load 2.   Her dysesthesias could represent either sequela of her initial MS exacerbation or an L5 radiculopathy. Either way, the symptoms are too mild to recommend any treatment. 3.   She had a vitamin D deficiency in the past and this needs to be followed up with another vitamin D level shown that low vitamin D linked to worsening relapses in MS. 4.   She will return to see me in one year or sooner if there are new or worsening neurologic symptoms.    Richard A.  Felecia Shelling, MD, PhD [id number], 0000000 AM Certified in Neurology, Clinical Neurophysiology, Sleep Medicine, Pain Medicine and Neuroimaging  [facility] [address] [place], [place] [address] [phone]

## 2015-07-04 LAB — VITAMIN D 25 HYDROXY (VIT D DEFICIENCY, FRACTURES): VIT D 25 HYDROXY: 32.4 ng/mL (ref 30.0–100.0)

## 2015-07-07 ENCOUNTER — Telehealth: Payer: Self-pay | Admitting: *Deleted

## 2015-07-07 NOTE — Telephone Encounter (Signed)
-----   Message from Britt Bottom, MD sent at 07/04/2015  6:59 PM EDT ----- Vitamin D is low normal. She can just take over-the-counter vitamin D 5000 units daily.

## 2015-07-07 NOTE — Telephone Encounter (Signed)
I have spoken with Traci Watkins this afternoon, and per RAS, advised that vit. D level is low normal (32 and normal is 30-100).  She should take otc vit. d 5,000iu daily.  She verbalized understanding of same/fim

## 2015-07-29 ENCOUNTER — Other Ambulatory Visit: Payer: Self-pay | Admitting: Nurse Practitioner

## 2015-07-29 DIAGNOSIS — C50919 Malignant neoplasm of unspecified site of unspecified female breast: Secondary | ICD-10-CM

## 2015-08-29 ENCOUNTER — Encounter: Payer: Self-pay | Admitting: Genetic Counselor

## 2015-12-18 ENCOUNTER — Other Ambulatory Visit: Payer: Self-pay | Admitting: Oncology

## 2015-12-18 DIAGNOSIS — Z853 Personal history of malignant neoplasm of breast: Secondary | ICD-10-CM

## 2015-12-31 DIAGNOSIS — N946 Dysmenorrhea, unspecified: Secondary | ICD-10-CM | POA: Diagnosis not present

## 2015-12-31 DIAGNOSIS — G35 Multiple sclerosis: Secondary | ICD-10-CM | POA: Diagnosis not present

## 2015-12-31 DIAGNOSIS — D239 Other benign neoplasm of skin, unspecified: Secondary | ICD-10-CM | POA: Diagnosis not present

## 2015-12-31 DIAGNOSIS — D0511 Intraductal carcinoma in situ of right breast: Secondary | ICD-10-CM | POA: Diagnosis not present

## 2015-12-31 DIAGNOSIS — Z23 Encounter for immunization: Secondary | ICD-10-CM | POA: Diagnosis not present

## 2016-01-21 ENCOUNTER — Ambulatory Visit
Admission: RE | Admit: 2016-01-21 | Discharge: 2016-01-21 | Disposition: A | Discharge status: Home / Self Care | Payer: BLUE CROSS/BLUE SHIELD | Source: Ambulatory Visit | Attending: Oncology | Admitting: Oncology

## 2016-01-21 DIAGNOSIS — Z853 Personal history of malignant neoplasm of breast: Secondary | ICD-10-CM

## 2016-01-21 DIAGNOSIS — R928 Other abnormal and inconclusive findings on diagnostic imaging of breast: Secondary | ICD-10-CM | POA: Diagnosis not present

## 2016-05-25 ENCOUNTER — Telehealth: Payer: Self-pay | Admitting: *Deleted

## 2016-05-25 MED ORDER — PHENTERMINE HCL 37.5 MG PO CAPS: 37.5000 mg | ORAL_CAPSULE | ORAL | 5 refills | Status: DC

## 2016-05-25 NOTE — Telephone Encounter (Signed)
Pt. accompanied her dtr. to our office today for dtr's appt.  While here, sts. she is having difficulty losing wt., partly due to fatigue related to MS.  Would like to know if there is a med that help.  per RAS, ok for Phentermine 37.5mg  po daily.  Pt. agreeable. Rx. given directly to pt/fim

## 2016-07-02 ENCOUNTER — Ambulatory Visit (INDEPENDENT_AMBULATORY_CARE_PROVIDER_SITE_OTHER): Payer: BLUE CROSS/BLUE SHIELD | Admitting: Neurology

## 2016-07-02 ENCOUNTER — Encounter (INDEPENDENT_AMBULATORY_CARE_PROVIDER_SITE_OTHER): Payer: Self-pay

## 2016-07-02 ENCOUNTER — Encounter: Payer: Self-pay | Admitting: Neurology

## 2016-07-02 VITALS — BP 116/68 | HR 70 | Resp 18 | Ht 68.0 in | Wt 200.5 lb

## 2016-07-02 DIAGNOSIS — R208 Other disturbances of skin sensation: Secondary | ICD-10-CM | POA: Diagnosis not present

## 2016-07-02 DIAGNOSIS — G35 Multiple sclerosis: Secondary | ICD-10-CM | POA: Diagnosis not present

## 2016-07-02 DIAGNOSIS — Z853 Personal history of malignant neoplasm of breast: Secondary | ICD-10-CM

## 2016-07-02 DIAGNOSIS — E559 Vitamin D deficiency, unspecified: Secondary | ICD-10-CM

## 2016-07-02 DIAGNOSIS — G4700 Insomnia, unspecified: Secondary | ICD-10-CM

## 2016-07-02 DIAGNOSIS — G35D Multiple sclerosis, unspecified: Secondary | ICD-10-CM

## 2016-07-02 NOTE — Progress Notes (Signed)
[facility]  PATIENT: Traci Watkins DOB: 09-10-1970  REFERRING DOCTOR OR PCP:  Carlos Levering [facility]) SOURCE: patient and EMR records  _________________________________   HISTORICAL  CHIEF COMPLAINT:  Chief Complaint  Patient presents with  . Multiple Sclerosis    Sts. she continues to do well off of dmt.  She was also able to stop Tamoxifen for hx. of breast ca in Aug. 2017/fim    HISTORY OF PRESENT ILLNESS:  Traci Watkins is a 46 y.o. woman with relapsing remitting multiple sclerosis.        MS:     Her MS has been stable for the most part. She had only one exacerbation in the past 10 years. In July 2016 she had a small episode with left leg weakness. The left leg was also involved with her initial exacerbation in 2000. She received several days of IV steroids to 2016. MRI of the brain just showed a couple of MS plaques in the brain MRI of the cervical spine at that time (09/16/2014) did not show any plaques.    Breast Cancer:   She is doing well and her tamoxifen was recently stopped.  She was diagnosed > 5 years ago.    She has a mammography later this year.   She was treated with surgery and RadRx and tamoxifen.   No classic chemotherapy.    Gait/strength/sensation: She is walking without difficulty. Every now and then, when she is more tired the left leg will seem slightly clumsy but this does not happen often.   She has some left leg dysesthesias and it feels different than the right     Bowel/bladder: She denies any difficulty with bladder or bowel function.  Vision: She denies any MS related visual problems. She has never had optic neuritis or diplopia. There is no eye pain.  Fatigue/sleep. She feels her fatigue is doing better on phentermine.   She gets mild dry mouth and occasional some insomnia.    She feels less exhausted.  She is on phentermine 37.5 mg and also lost a few pounds.      Mood/cognition: She denies any depression.   She has not  noted any cognitive issues at work or at home.   She works as a Government social research officer.     Vit D deficiency:  She takes Vit D 5000 U daily.   Her level was 32.4 last year.      FH:  Her cousin was diagnosed with MS at around age 76 last year.  MS history: In 2000 presented with right facial numbness and later that year had left leg numbness. An MRI and lumbar puncture were performed. She was diagnosed with probable multiple sclerosis. One year later and another MRI was performed that showed additional lesions. She then was diagnosed with clinically definite multiple sclerosis. She was placed on the Betaseron she stopped Betaseron around 2009 due to side effects of the medication. Clinically, she had done well with no exacerbations.   At that time, she had had several stable MRIs during the interim additionally, she has had no further clinical relapses. We made an agreement that she will continue to get occasional MRI monitoring to make sure that there was not subclinical progression of her disease. He rlast MRI was Fortunately, several more MRIs over the last 6 years have shown no further disease activity. Additionally, she has had no clinical relapses and no progression of disability.    REVIEW OF SYSTEMS: Constitutional: No fevers, chills,  sweats, or change in appetite Eyes: No visual changes, double vision, eye pain Ear, nose and throat: No hearing loss, ear pain, nasal congestion, sore throat Cardiovascular: No chest pain, palpitations Respiratory: No shortness of breath at rest or with exertion.   No wheezes GastrointestinaI: No nausea, vomiting, diarrhea, abdominal pain, fecal incontinence Genitourinary: No dysuria, urinary retention or frequency.  No nocturia. Musculoskeletal: No neck pain, back pain Integumentary: No rash, pruritus, skin lesions Neurological: as above Psychiatric: No depression at this time.  Some anxiety Endocrine: No palpitations, diaphoresis, change in appetite, change in  weigh or increased thirst Hematologic/Lymphatic: No anemia, purpura, petechiae. Allergic/Immunologic: No itchy/runny eyes, nasal congestion, recent allergic reactions, rashes  ALLERGIES: No Known Allergies  HOME MEDICATIONS:  Current Outpatient Prescriptions:  .  cholecalciferol (VITAMIN D) 1000 units tablet, Take 5,000 Units by mouth daily., Disp: , Rfl:  .  ibuprofen (ADVIL,MOTRIN) 800 MG tablet, Take 800 mg by mouth every 8 (eight) hours as needed., Disp: , Rfl:  .  Multiple Vitamin (MULTIVITAMIN) tablet, Take 1 tablet by mouth daily., Disp: , Rfl:  .  PARAGARD INTRAUTERINE COPPER IUD IUD, 1 each by Intrauterine route once., Disp: , Rfl:  .  phentermine 37.5 MG capsule, Take 1 capsule (37.5 mg total) by mouth every morning., Disp: 30 capsule, Rfl: 5 .  tamoxifen (NOLVADEX) 20 MG tablet, TAKE 1 TABLET(20 MG) BY MOUTH DAILY (Patient not taking: Reported on 07/02/2016), Disp: 90 tablet, Rfl: 0  PAST MEDICAL HISTORY: Past Medical History:  Diagnosis Date  . Anemia    during pregnancy  . Anxiety   . Breast cancer ([place]) 03/24/10   /04/29/10 lumpectomt dcis  . MS (multiple sclerosis) ([place])   . Multiple sclerosis ([place])    10 years,tx with interferon    PAST SURGICAL HISTORY: Past Surgical History:  Procedure Laterality Date  . BREAST LUMPECTOMY  04/29/10   right  . LAPAROSCOPIC CHOLECYSTECTOMY  07/26/08    FAMILY HISTORY: Family History  Problem Relation Age of Onset  . Cancer Father        bladder  . Aneurysm Father     SOCIAL HISTORY:  Social History   Social History  . Marital status: Married    Spouse name: N/A  . Number of children: 1  . Years of education: N/A   Occupational History  . Curator   Social History Main Topics  . Smoking status: Former Smoker    Packs/day: 0.50    Types: Cigarettes  . Smokeless tobacco: Never Used  . Alcohol use Yes     Comment: socially  . Drug use: No     Comment: 10 years,quit 2008  . Sexual  activity: Not on file   Other Topics Concern  . Not on file   Social History Narrative  . No narrative on file     PHYSICAL EXAM  Vitals:   07/02/16 0821  BP: 116/68  Pulse: 70  Resp: 18  Weight: 200 lb 8 oz (90.9 kg)  Height: 5\' 8"  (1.727 m)    Body mass index is 30.49 kg/m.   General: The patient is well-developed and well-nourished and in no acute distress   Neurologic Exam  Mental status: The patient is alert and oriented x 3 at the time of the examination. The patient has apparent normal recent and remote memory, with an apparently normal attention span and concentration ability.   Speech is normal.  Cranial nerves: Extraocular movements are full. Patient's strength and sensation is normal.  Trapezius and sternocleidomastoid strength is normal. No dysarthria is noted.  The tongue is midline, and the patient has symmetric elevation of the soft palate. No obvious hearing deficits are noted.  Motor:  Muscle bulk is normal.   Tone is normal. Strength is  5 / 5 in all 4 extremities.   Sensory: Sensation is normal in the arms to touch, temperature and vibration. She has mildly reduced temperature and vibration sensation in the left leg compared to the right leg. This is chronic.   Coordination: Cerebellar testing reveals good finger-nose-finger and heel-to-shin bilaterally.  Gait and station: Station is normal.   Gait is normal. Tandem gait is mildly wide. Romberg is negative.   Reflexes: Deep tendon reflexes are symmetric and normal in arms but left knee DTR mildly increased compared to the right.         DIAGNOSTIC DATA (LABS, IMAGING, TESTING) - I reviewed patient records, labs, notes, testing and imaging myself where available.  Lab Results  Component Value Date   WBC 9.6 05/14/2015   HGB 12.9 05/14/2015   HCT 38.7 05/14/2015   MCV 84.9 05/14/2015   PLT 284 05/14/2015      Component Value Date/Time   NA 140 05/14/2015 1537   K 4.1 05/14/2015 1537   CL  107 04/03/2012 0847   CO2 26 05/14/2015 1537   GLUCOSE 114 05/14/2015 1537   GLUCOSE 98 04/03/2012 0847   BUN 10.7 05/14/2015 1537   CREATININE 0.8 05/14/2015 1537   CALCIUM 9.3 05/14/2015 1537   PROT 6.9 05/14/2015 1537   ALBUMIN 3.8 05/14/2015 1537   AST 14 05/14/2015 1537   ALT 15 05/14/2015 1537   ALKPHOS 29 (L) 05/14/2015 1537   BILITOT 0.39 05/14/2015 1537      ASSESSMENT AND PLAN  Multiple sclerosis (HCC)  Dysesthesia  Insomnia, unspecified type  Hx Breast Cancer (DCIS) Right, Stage 0, receptor +  Vitamin D deficiency   1.  She has been stable off of any disease modifying therapies. Her lesion load is very low and she has had only a couple of exacerbations her entire life. She had a lot of difficulty tolerating Betaseron. She wishes to remain off of a disease modifying therapy. Sometime within the next 6 months, we will try to get an MRI of the brain.  2.   Continue vitamin D.  3.    Continue phentermine at the current dose. We discussed reducing the dose if she has more episodes of insomnia.  4.   She will return to see me in one year or sooner if there are new or worsening neurologic symptoms.    Ormand Senn A. Felecia Shelling, MD, PhD 1/85/6314, 9:70 AM Certified in Neurology, Clinical Neurophysiology, Sleep Medicine, Pain Medicine and Neuroimaging  [facility] [address] [place], [place] [address] [phone]

## 2016-07-07 ENCOUNTER — Other Ambulatory Visit (HOSPITAL_COMMUNITY)
Admission: RE | Admit: 2016-07-07 | Discharge: 2016-07-07 | Disposition: A | Discharge status: Home / Self Care | Payer: BLUE CROSS/BLUE SHIELD | Source: Ambulatory Visit | Attending: Obstetrics and Gynecology | Admitting: Obstetrics and Gynecology

## 2016-07-07 ENCOUNTER — Other Ambulatory Visit: Payer: Self-pay | Admitting: Obstetrics and Gynecology

## 2016-07-07 DIAGNOSIS — Z124 Encounter for screening for malignant neoplasm of cervix: Secondary | ICD-10-CM | POA: Insufficient documentation

## 2016-07-07 DIAGNOSIS — N939 Abnormal uterine and vaginal bleeding, unspecified: Secondary | ICD-10-CM | POA: Diagnosis not present

## 2016-07-07 DIAGNOSIS — Z01419 Encounter for gynecological examination (general) (routine) without abnormal findings: Secondary | ICD-10-CM | POA: Diagnosis not present

## 2016-07-07 DIAGNOSIS — R7303 Prediabetes: Secondary | ICD-10-CM | POA: Diagnosis not present

## 2016-07-09 LAB — CYTOLOGY - PAP
Diagnosis: NEGATIVE
HPV (WINDOPATH): NOT DETECTED

## 2016-08-09 DIAGNOSIS — H5213 Myopia, bilateral: Secondary | ICD-10-CM | POA: Diagnosis not present

## 2016-08-10 DIAGNOSIS — N939 Abnormal uterine and vaginal bleeding, unspecified: Secondary | ICD-10-CM | POA: Diagnosis not present

## 2016-08-24 DIAGNOSIS — Z30432 Encounter for removal of intrauterine contraceptive device: Secondary | ICD-10-CM | POA: Diagnosis not present

## 2016-09-13 DIAGNOSIS — N939 Abnormal uterine and vaginal bleeding, unspecified: Secondary | ICD-10-CM | POA: Diagnosis not present

## 2016-11-20 ENCOUNTER — Other Ambulatory Visit: Payer: Self-pay | Admitting: Neurology

## 2016-12-13 ENCOUNTER — Other Ambulatory Visit: Payer: Self-pay | Admitting: Obstetrics and Gynecology

## 2016-12-13 DIAGNOSIS — Z139 Encounter for screening, unspecified: Secondary | ICD-10-CM

## 2017-01-21 ENCOUNTER — Other Ambulatory Visit: Payer: Self-pay | Admitting: Obstetrics and Gynecology

## 2017-01-21 ENCOUNTER — Ambulatory Visit
Admission: RE | Admit: 2017-01-21 | Discharge: 2017-01-21 | Disposition: A | Discharge status: Home / Self Care | Payer: BLUE CROSS/BLUE SHIELD | Source: Ambulatory Visit | Attending: Obstetrics and Gynecology | Admitting: Obstetrics and Gynecology

## 2017-01-21 DIAGNOSIS — Z853 Personal history of malignant neoplasm of breast: Secondary | ICD-10-CM

## 2017-01-21 DIAGNOSIS — R928 Other abnormal and inconclusive findings on diagnostic imaging of breast: Secondary | ICD-10-CM

## 2017-01-21 DIAGNOSIS — N6310 Unspecified lump in the right breast, unspecified quadrant: Secondary | ICD-10-CM

## 2017-01-21 DIAGNOSIS — N6489 Other specified disorders of breast: Secondary | ICD-10-CM | POA: Diagnosis not present

## 2017-01-21 DIAGNOSIS — Z139 Encounter for screening, unspecified: Secondary | ICD-10-CM

## 2017-01-21 HISTORY — DX: Personal history of irradiation: Z92.3

## 2017-06-21 ENCOUNTER — Other Ambulatory Visit: Payer: Self-pay | Admitting: Neurology

## 2017-07-06 ENCOUNTER — Ambulatory Visit: Payer: BLUE CROSS/BLUE SHIELD | Admitting: Neurology

## 2017-07-06 ENCOUNTER — Other Ambulatory Visit: Payer: Self-pay

## 2017-07-06 ENCOUNTER — Encounter: Payer: Self-pay | Admitting: Neurology

## 2017-07-06 ENCOUNTER — Telehealth: Payer: Self-pay | Admitting: Neurology

## 2017-07-06 VITALS — BP 131/87 | HR 88 | Resp 16 | Ht 68.0 in | Wt 189.0 lb

## 2017-07-06 DIAGNOSIS — M7918 Myalgia, other site: Secondary | ICD-10-CM | POA: Insufficient documentation

## 2017-07-06 DIAGNOSIS — Z853 Personal history of malignant neoplasm of breast: Secondary | ICD-10-CM

## 2017-07-06 DIAGNOSIS — R208 Other disturbances of skin sensation: Secondary | ICD-10-CM | POA: Diagnosis not present

## 2017-07-06 DIAGNOSIS — G35 Multiple sclerosis: Secondary | ICD-10-CM | POA: Diagnosis not present

## 2017-07-06 MED ORDER — PHENTERMINE HCL 37.5 MG PO CAPS: 37.5000 mg | ORAL_CAPSULE | Freq: Every morning | ORAL | 5 refills | Status: DC

## 2017-07-06 NOTE — Telephone Encounter (Signed)
[employer] Auth: [id number] (exp. 07/06/17 to 09/03/17)  LVM for pt to call back about scheduling mri

## 2017-07-06 NOTE — Progress Notes (Signed)
[facility]  PATIENT: Traci Watkins DOB: 12-02-1970  REFERRING DOCTOR OR PCP:  Carlos Levering [facility]) SOURCE: patient and EMR records  _________________________________   HISTORICAL  CHIEF COMPLAINT:  Chief Complaint  Patient presents with  . Multiple Sclerosis    Sts. she continues to do well off of dmt. Has lost 11 lbs since last ov./fim    HISTORY OF PRESENT ILLNESS:  Traci Watkins is a 47 y.o. woman with relapsing remitting multiple sclerosis.     Update 07/06/2017: The last MRI of the brain was performed 09/16/2014 and showed no new lesions (only 3-4 old ones).   The cervical spine was normal.    She stopped Betaseron in 2009 and has had no further relapse.       She is doing well and denies any difficulty with gait or balance.    Strength and sensation are fine.    She has not had further episodes of left leg dysesthesia.    Vision is doing well.    Bladder function is fine.   Fatigue is doing well despite sleep maintenance insomnia.    She has never taken anything for the insomnia except melatonin a few times years ago.     Mood is doing well and she has less stress than 6 months ago when her husband was getting RadRx and chemotherapy.     She denies cognitive issues.     She reports pain in her left lower back since a fall on her left side 4 months ago.    Pain does not radiate into leg but it moves some into the hip.   Pain is worse with sitting a long time and with rolling over in bed.    Ice helps some but Ibuprofen has not helped.     She has breast cancer and sees oncology regularly and is in remission x 8-9 and has a mammogram annually.    From 07/02/2016:   MS:     Her MS has been stable for the most part. She had only one exacerbation in the past 10 years. In July 2016 she had a small episode with left leg weakness. The left leg was also involved with her initial exacerbation in 2000. She received several days of IV steroids to 2016. MRI of the brain  just showed a couple of MS plaques in the brain MRI of the cervical spine at that time (09/16/2014) did not show any plaques.    Breast Cancer:   She is doing well and her tamoxifen was recently stopped.  She was diagnosed > 5 years ago.    She has a mammography later this year.   She was treated with surgery and RadRx and tamoxifen.   No classic chemotherapy.    Gait/strength/sensation: She is walking without difficulty. Every now and then, when she is more tired the left leg will seem slightly clumsy but this does not happen often.   She has some left leg dysesthesias and it feels different than the right     Bowel/bladder: She denies any difficulty with bladder or bowel function.  Vision: She denies any MS related visual problems. She has never had optic neuritis or diplopia. There is no eye pain.  Fatigue/sleep. She feels her fatigue is doing better on phentermine.   She gets mild dry mouth and occasional some insomnia.    She feels less exhausted.  She is on phentermine 37.5 mg and also lost a few pounds.  Mood/cognition: She denies any depression.   She has not noted any cognitive issues at work or at home.   She works as a Government social research officer.     Vit D deficiency:  She takes Vit D 5000 U daily.   Her level was 32.4 last year.      FH:  Her cousin was diagnosed with MS at around age 19 last year.  MS history: In 2000 presented with right facial numbness and later that year had left leg numbness. An MRI and lumbar puncture were performed. She was diagnosed with probable multiple sclerosis. One year later and another MRI was performed that showed additional lesions. She then was diagnosed with clinically definite multiple sclerosis. She was placed on the Betaseron she stopped Betaseron around 2009 due to side effects of the medication. Clinically, she had done well with no exacerbations.   At that time, she had had several stable MRIs during the interim additionally, she has had no further  clinical relapses. We made an agreement that she will continue to get occasional MRI monitoring to make sure that there was not subclinical progression of her disease. He rlast MRI was Fortunately, several more MRIs over the last 6 years have shown no further disease activity. Additionally, she has had no clinical relapses and no progression of disability.    REVIEW OF SYSTEMS: Constitutional: No fevers, chills, sweats, or change in appetite Eyes: No visual changes, double vision, eye pain Ear, nose and throat: No hearing loss, ear pain, nasal congestion, sore throat Cardiovascular: No chest pain, palpitations Respiratory: No shortness of breath at rest or with exertion.   No wheezes GastrointestinaI: No nausea, vomiting, diarrhea, abdominal pain, fecal incontinence Genitourinary: No dysuria, urinary retention or frequency.  No nocturia. Musculoskeletal: No neck pain, back pain Integumentary: No rash, pruritus, skin lesions Neurological: as above Psychiatric: No depression at this time.  Some anxiety Endocrine: No palpitations, diaphoresis, change in appetite, change in weigh or increased thirst Hematologic/Lymphatic: No anemia, purpura, petechiae. Allergic/Immunologic: No itchy/runny eyes, nasal congestion, recent allergic reactions, rashes  ALLERGIES: No Known Allergies  HOME MEDICATIONS:  Current Outpatient Medications:  .  cholecalciferol (VITAMIN D) 1000 units tablet, Take 5,000 Units by mouth daily., Disp: , Rfl:  .  Multiple Vitamin (MULTIVITAMIN) tablet, Take 1 tablet by mouth daily., Disp: , Rfl:  .  phentermine 37.5 MG capsule, Take 1 capsule (37.5 mg total) by mouth every morning., Disp: 30 capsule, Rfl: 5 .  ibuprofen (ADVIL,MOTRIN) 800 MG tablet, Take 800 mg by mouth every 8 (eight) hours as needed., Disp: , Rfl:  .  PARAGARD INTRAUTERINE COPPER IUD IUD, 1 each by Intrauterine route once., Disp: , Rfl:  .  tamoxifen (NOLVADEX) 20 MG tablet, TAKE 1 TABLET(20 MG) BY MOUTH  DAILY (Patient not taking: Reported on 07/02/2016), Disp: 90 tablet, Rfl: 0  PAST MEDICAL HISTORY: Past Medical History:  Diagnosis Date  . Anemia    during pregnancy  . Anxiety   . Breast cancer ([place]) 03/24/10   /04/29/10 lumpectomt dcis  . MS (multiple sclerosis) ([place])   . Multiple sclerosis ([place])    10 years,tx with interferon  . Personal history of radiation therapy 2012    PAST SURGICAL HISTORY: Past Surgical History:  Procedure Laterality Date  . BREAST BIOPSY Right 04/03/2010  . BREAST LUMPECTOMY Right 04/29/10  . LAPAROSCOPIC CHOLECYSTECTOMY  07/26/08    FAMILY HISTORY: Family History  Problem Relation Age of Onset  . Cancer Father  bladder  . Aneurysm Father   . Breast cancer Maternal Uncle     SOCIAL HISTORY:  Social History   Socioeconomic History  . Marital status: Married    Spouse name: Not on file  . Number of children: 1  . Years of education: Not on file  . Highest education level: Not on file  Occupational History  . Occupation: Health visitor: [employer]  Social Needs  . Financial resource strain: Not on file  . Food insecurity:    Worry: Not on file    Inability: Not on file  . Transportation needs:    Medical: Not on file    Non-medical: Not on file  Tobacco Use  . Smoking status: Former Smoker    Packs/day: 0.50    Types: Cigarettes  . Smokeless tobacco: Never Used  Substance and Sexual Activity  . Alcohol use: Yes    Comment: socially  . Drug use: No    Comment: 10 years,quit 2008  . Sexual activity: Not on file  Lifestyle  . Physical activity:    Days per week: Not on file    Minutes per session: Not on file  . Stress: Not on file  Relationships  . Social connections:    Talks on phone: Not on file    Gets together: Not on file    Attends religious service: Not on file    Active member of club or organization: Not on file    Attends meetings of clubs or organizations: Not on file    Relationship  status: Not on file  . Intimate partner violence:    Fear of current or ex partner: Not on file    Emotionally abused: Not on file    Physically abused: Not on file    Forced sexual activity: Not on file  Other Topics Concern  . Not on file  Social History Narrative  . Not on file     PHYSICAL EXAM  Vitals:   07/06/17 0827  BP: 131/87  Pulse: 88  Resp: 16  Weight: 189 lb (85.7 kg)  Height: 5\' 8"  (1.727 m)    Body mass index is 28.74 kg/m.   General: The patient is well-developed and well-nourished and in no acute distress.  She is tender over the left piriformis muscle.  No tenderness over the trochanteric bursae or the lower lumbar paraspinal muscles.   Neurologic Exam  Mental status: The patient is alert and oriented x 3 at the time of the examination. The patient has apparent normal recent and remote memory, with an apparently normal attention span and concentration ability.   Speech is normal.  Cranial nerves: Extraocular movements are full. Patient's strength and sensation is normal. Trapezius and sternocleidomastoid strength is normal. No dysarthria is noted.  The tongue is midline, and the patient has symmetric elevation of the soft palate. No obvious hearing deficits are noted.  Motor:  Muscle bulk is normal.   Tone is normal. Strength is  5 / 5 in all 4 extremities.   Sensory: She has normal sensation in the arms.. She has mildly reduced temperature and vibration sensation in the left leg compared to the right leg. This is chronic.   Coordination: Cerebellar testing reveals normal finger-nose-finger and heel-to-shin.  Gait and station: Station is normal.   Gait is normal. Tandem gait is mildly wide. Romberg is negative.   Reflexes: Deep tendon reflexes are symmetric and normal in arms but mildly increased DTR at  the left knee.    DIAGNOSTIC DATA (LABS, IMAGING, TESTING) - I reviewed patient records, labs, notes, testing and imaging myself where  available.  Lab Results  Component Value Date   WBC 9.6 05/14/2015   HGB 12.9 05/14/2015   HCT 38.7 05/14/2015   MCV 84.9 05/14/2015   PLT 284 05/14/2015      Component Value Date/Time   NA 140 05/14/2015 1537   K 4.1 05/14/2015 1537   CL 107 04/03/2012 0847   CO2 26 05/14/2015 1537   GLUCOSE 114 05/14/2015 1537   GLUCOSE 98 04/03/2012 0847   BUN 10.7 05/14/2015 1537   CREATININE 0.8 05/14/2015 1537   CALCIUM 9.3 05/14/2015 1537   PROT 6.9 05/14/2015 1537   ALBUMIN 3.8 05/14/2015 1537   AST 14 05/14/2015 1537   ALT 15 05/14/2015 1537   ALKPHOS 29 (L) 05/14/2015 1537   BILITOT 0.39 05/14/2015 1537      ASSESSMENT AND PLAN  Multiple sclerosis (HCC) - Plan: MR BRAIN W WO CONTRAST  Hx Breast Cancer (DCIS) Right, Stage 0, receptor +  Dysesthesia  Left buttock pain   1.   She will remain off of a disease modifying therapy for her relapsing remitting MS.  She has remained or of therapy for many years.  We need to check an MRI of the brain to determine if there is any subclinical progression.  If present, I would recommend that she go on a disease modifying therapy. And start a DMT if she has a relapse or significant changes on imaging.  2.   She is tender over the piriformis muscle.  She will take Naprosyn and was instructed on piriformis stretch exercises.  If not better, consider a steroid pack or piriformis injection. 3.    Continue phentermine at the current dose.  4.    She will return to see me in one year or sooner if there are new or worsening neurologic symptoms.  Mri  Phentermine  Naproxen if not better can do sterodi pack or inj, do exer/stret  Teyanna Thielman A. Felecia Shelling, MD, PhD 7/61/9509, 3:26 PM Certified in Neurology, Clinical Neurophysiology, Sleep Medicine, Pain Medicine and Neuroimaging  [facility] [address] [place], [place] [address] [phone]

## 2017-07-06 NOTE — Patient Instructions (Signed)
Look on [email or web address] for piriformis muscle stretches

## 2017-07-13 DIAGNOSIS — Z01419 Encounter for gynecological examination (general) (routine) without abnormal findings: Secondary | ICD-10-CM | POA: Diagnosis not present

## 2017-07-13 NOTE — Telephone Encounter (Signed)
Patient returned my call she is scheduled for 07/20/17 on the [facility] mobile unit.

## 2017-07-19 ENCOUNTER — Ambulatory Visit: Payer: BLUE CROSS/BLUE SHIELD

## 2017-07-19 DIAGNOSIS — G35 Multiple sclerosis: Secondary | ICD-10-CM | POA: Diagnosis not present

## 2017-07-19 DIAGNOSIS — Z1329 Encounter for screening for other suspected endocrine disorder: Secondary | ICD-10-CM | POA: Diagnosis not present

## 2017-07-19 DIAGNOSIS — R7309 Other abnormal glucose: Secondary | ICD-10-CM | POA: Diagnosis not present

## 2017-07-19 DIAGNOSIS — Z13220 Encounter for screening for lipoid disorders: Secondary | ICD-10-CM | POA: Diagnosis not present

## 2017-07-19 MED ORDER — GADOPENTETATE DIMEGLUMINE 469.01 MG/ML IV SOLN
17.0000 mL | Freq: Once | INTRAVENOUS | Status: AC | PRN
  Administered 2017-07-19: 17 mL via INTRAVENOUS

## 2017-07-20 ENCOUNTER — Telehealth: Payer: Self-pay | Admitting: Neurology

## 2017-07-20 ENCOUNTER — Other Ambulatory Visit: Payer: BLUE CROSS/BLUE SHIELD

## 2017-07-20 NOTE — Telephone Encounter (Signed)
I tried to reach Traci Watkins but got her voicemail.  The MRI of the brain shows one small spot that is new on the current MRI that was not present in 2016.  She is off of any disease modifying therapy (many years).  Since there is just one small spot we do not have to make any changes at this point.  We will probably want to recheck an MRI in about another 1 1/2 to 2 years.

## 2017-07-21 ENCOUNTER — Encounter: Payer: Self-pay | Admitting: Neurology

## 2017-07-21 ENCOUNTER — Ambulatory Visit: Payer: BLUE CROSS/BLUE SHIELD | Admitting: Neurology

## 2017-07-21 ENCOUNTER — Other Ambulatory Visit: Payer: Self-pay

## 2017-07-21 VITALS — BP 122/84 | HR 88 | Resp 18 | Ht 68.0 in

## 2017-07-21 DIAGNOSIS — G35 Multiple sclerosis: Secondary | ICD-10-CM | POA: Diagnosis not present

## 2017-07-21 DIAGNOSIS — Z79899 Other long term (current) drug therapy: Secondary | ICD-10-CM

## 2017-07-21 DIAGNOSIS — G35D Multiple sclerosis, unspecified: Secondary | ICD-10-CM

## 2017-07-21 NOTE — Progress Notes (Signed)
[facility]  PATIENT: Traci Watkins DOB: 12-Nov-1970  REFERRING DOCTOR OR PCP:  Carlos Levering [facility]) SOURCE: patient and EMR records  _________________________________   HISTORICAL  CHIEF COMPLAINT:  Chief Complaint  Patient presents with  . Multiple Sclerosis    Discuss MRI results/fim    HISTORY OF PRESENT ILLNESS:  Traci Watkins is a 47 y.o. woman with relapsing remitting multiple sclerosis.     Update 07/21/2017: She had an MRI of the brain 07/19/2017.  It shows one additional focus in the left frontal juxtacortical white matter that was not present on the 09/13/2014 MRI.  The foci is looked unchanged.  I reviewed the MRI images in front of her and her husband.  We compared the MRI to her 2016 MRI.  She has been off of any disease modifying therapy since 2009.  He stopped the beta for due to difficulties with the injections.  She had skin necrosis of these months.  Since that time, she has had 3 or 4 brain MRIs and all of the other MRIs showed stable low lesion load in the brain.  The current MRI is the first 1 to show changes since she stopped the medication.  We had a long discussion about the significance of the findings in detail discussed several disease modifying therapy.  Update 07/06/2017: The last MRI of the brain was performed 09/16/2014 and showed no new lesions (only 3-4 old ones).   The cervical spine was normal.    She stopped Betaseron in 2009 and has had no further relapse.       She is doing well and denies any difficulty with gait or balance.    Strength and sensation are fine.    She has not had further episodes of left leg dysesthesia.    Vision is doing well.    Bladder function is fine.   Fatigue is doing well despite sleep maintenance insomnia.    She has never taken anything for the insomnia except melatonin a few times years ago.     Mood is doing well and she has less stress than 6 months ago when her husband was getting RadRx and  chemotherapy.     She denies cognitive issues.     She reports pain in her left lower back since a fall on her left side 4 months ago.    Pain does not radiate into leg but it moves some into the hip.   Pain is worse with sitting a long time and with rolling over in bed.    Ice helps some but Ibuprofen has not helped.     She has breast cancer and sees oncology regularly and is in remission x 8-9 and has a mammogram annually.    From 07/02/2016:   MS:     Her MS has been stable for the most part. She had only one exacerbation in the past 10 years. In July 2016 she had a small episode with left leg weakness. The left leg was also involved with her initial exacerbation in 2000. She received several days of IV steroids to 2016. MRI of the brain just showed a couple of MS plaques in the brain MRI of the cervical spine at that time (09/16/2014) did not show any plaques.    Breast Cancer:   She is doing well and her tamoxifen was recently stopped.  She was diagnosed > 5 years ago.    She has a mammography later this year.   She  was treated with surgery and RadRx and tamoxifen.   No classic chemotherapy.    Gait/strength/sensation: She is walking without difficulty. Every now and then, when she is more tired the left leg will seem slightly clumsy but this does not happen often.   She has some left leg dysesthesias and it feels different than the right     Bowel/bladder: She denies any difficulty with bladder or bowel function.  Vision: She denies any MS related visual problems. She has never had optic neuritis or diplopia. There is no eye pain.  Fatigue/sleep. She feels her fatigue is doing better on phentermine.   She gets mild dry mouth and occasional some insomnia.    She feels less exhausted.  She is on phentermine 37.5 mg and also lost a few pounds.      Mood/cognition: She denies any depression.   She has not noted any cognitive issues at work or at home.   She works as a Government social research officer.     Vit D  deficiency:  She takes Vit D 5000 U daily.   Her level was 32.4 last year.      FH:  Her cousin was diagnosed with MS at around age 13 last year.  MS history: In 2000 presented with right facial numbness and later that year had left leg numbness. An MRI and lumbar puncture were performed. She was diagnosed with probable multiple sclerosis. One year later and another MRI was performed that showed additional lesions. She then was diagnosed with clinically definite multiple sclerosis. She was placed on the Betaseron she stopped Betaseron around 2009 due to side effects of the medication. Clinically, she had done well with no exacerbations.   At that time, she had had several stable MRIs during the interim additionally, she has had no further clinical relapses. We made an agreement that she will continue to get occasional MRI monitoring to make sure that there was not subclinical progression of her disease. He rlast MRI was Fortunately, several more MRIs over the last 6 years have shown no further disease activity. Additionally, she has had no clinical relapses and no progression of disability.    REVIEW OF SYSTEMS: Constitutional: No fevers, chills, sweats, or change in appetite Eyes: No visual changes, double vision, eye pain Ear, nose and throat: No hearing loss, ear pain, nasal congestion, sore throat Cardiovascular: No chest pain, palpitations Respiratory: No shortness of breath at rest or with exertion.   No wheezes GastrointestinaI: No nausea, vomiting, diarrhea, abdominal pain, fecal incontinence Genitourinary: No dysuria, urinary retention or frequency.  No nocturia. Musculoskeletal: No neck pain, back pain Integumentary: No rash, pruritus, skin lesions Neurological: as above Psychiatric: No depression at this time.  Some anxiety Endocrine: No palpitations, diaphoresis, change in appetite, change in weigh or increased thirst Hematologic/Lymphatic: No anemia, purpura,  petechiae. Allergic/Immunologic: No itchy/runny eyes, nasal congestion, recent allergic reactions, rashes  ALLERGIES: No Known Allergies  HOME MEDICATIONS:  Current Outpatient Medications:  .  cholecalciferol (VITAMIN D) 1000 units tablet, Take 5,000 Units by mouth daily., Disp: , Rfl:  .  ibuprofen (ADVIL,MOTRIN) 800 MG tablet, Take 800 mg by mouth every 8 (eight) hours as needed., Disp: , Rfl:  .  Multiple Vitamin (MULTIVITAMIN) tablet, Take 1 tablet by mouth daily., Disp: , Rfl:  .  PARAGARD INTRAUTERINE COPPER IUD IUD, 1 each by Intrauterine route once., Disp: , Rfl:  .  phentermine 37.5 MG capsule, Take 1 capsule (37.5 mg total) by mouth every morning., Disp: 30  capsule, Rfl: 5 .  tamoxifen (NOLVADEX) 20 MG tablet, TAKE 1 TABLET(20 MG) BY MOUTH DAILY (Patient not taking: Reported on 07/02/2016), Disp: 90 tablet, Rfl: 0  PAST MEDICAL HISTORY: Past Medical History:  Diagnosis Date  . Anemia    during pregnancy  . Anxiety   . Breast cancer ([place]) 03/24/10   /04/29/10 lumpectomt dcis  . MS (multiple sclerosis) ([place])   . Multiple sclerosis ([place])    10 years,tx with interferon  . Personal history of radiation therapy 2012    PAST SURGICAL HISTORY: Past Surgical History:  Procedure Laterality Date  . BREAST BIOPSY Right 04/03/2010  . BREAST LUMPECTOMY Right 04/29/10  . LAPAROSCOPIC CHOLECYSTECTOMY  07/26/08    FAMILY HISTORY: Family History  Problem Relation Age of Onset  . Cancer Father        bladder  . Aneurysm Father   . Breast cancer Maternal Uncle     SOCIAL HISTORY:  Social History   Socioeconomic History  . Marital status: Married    Spouse name: Not on file  . Number of children: 1  . Years of education: Not on file  . Highest education level: Not on file  Occupational History  . Occupation: Health visitor: [employer]  Social Needs  . Financial resource strain: Not on file  . Food insecurity:    Worry: Not on file    Inability: Not on  file  . Transportation needs:    Medical: Not on file    Non-medical: Not on file  Tobacco Use  . Smoking status: Former Smoker    Packs/day: 0.50    Types: Cigarettes  . Smokeless tobacco: Never Used  Substance and Sexual Activity  . Alcohol use: Yes    Comment: socially  . Drug use: No    Comment: 10 years,quit 2008  . Sexual activity: Not on file  Lifestyle  . Physical activity:    Days per week: Not on file    Minutes per session: Not on file  . Stress: Not on file  Relationships  . Social connections:    Talks on phone: Not on file    Gets together: Not on file    Attends religious service: Not on file    Active member of club or organization: Not on file    Attends meetings of clubs or organizations: Not on file    Relationship status: Not on file  . Intimate partner violence:    Fear of current or ex partner: Not on file    Emotionally abused: Not on file    Physically abused: Not on file    Forced sexual activity: Not on file  Other Topics Concern  . Not on file  Social History Narrative  . Not on file     PHYSICAL EXAM  Vitals:   07/21/17 0951  BP: 122/84  Pulse: 88  Resp: 18  Height: 5\' 8"  (1.727 m)    Body mass index is 28.74 kg/m.   General: The patient is well-developed and well-nourished and in no acute distress.  She is tender over the left piriformis muscle.  No tenderness over the trochanteric bursae or the lower lumbar paraspinal muscles.   Neurologic Exam  Mental status: The patient is alert and oriented x 3 at the time of the examination. The patient has apparent normal recent and remote memory, with an apparently normal attention span and concentration ability.   Speech is normal.  Cranial nerves: Extraocular movements are  full.  Facial strength is normal.  Trapezius strength is normal.  The tongue is midline, and the patient has symmetric elevation of the soft palate. No obvious hearing deficits are noted.  Motor:  Muscle bulk is  normal.   Tone is normal. Strength is  5 / 5 in all 4 extremities.   Sensory: She has normal sensation in the arms.. She has mildly reduced temperature and vibration sensation in the left leg compared to the right leg. This is chronic.   Coordination: Cerebellar testing shows good finger-nose-finger  Gait and station: Station is normal.   Gait is normal. Tandem gait is mildly wide. Romberg is negative.   Reflexes: Deep tendon reflexes are symmetric and normal in arms but mildly increased DTR at the left knee.    DIAGNOSTIC DATA (LABS, IMAGING, TESTING) - I reviewed patient records, labs, notes, testing and imaging myself where available.  Lab Results  Component Value Date   WBC 9.6 05/14/2015   HGB 12.9 05/14/2015   HCT 38.7 05/14/2015   MCV 84.9 05/14/2015   PLT 284 05/14/2015      Component Value Date/Time   NA 140 05/14/2015 1537   K 4.1 05/14/2015 1537   CL 107 04/03/2012 0847   CO2 26 05/14/2015 1537   GLUCOSE 114 05/14/2015 1537   GLUCOSE 98 04/03/2012 0847   BUN 10.7 05/14/2015 1537   CREATININE 0.8 05/14/2015 1537   CALCIUM 9.3 05/14/2015 1537   PROT 6.9 05/14/2015 1537   ALBUMIN 3.8 05/14/2015 1537   AST 14 05/14/2015 1537   ALT 15 05/14/2015 1537   ALKPHOS 29 (L) 05/14/2015 1537   BILITOT 0.39 05/14/2015 1537      ASSESSMENT AND PLAN  Multiple sclerosis (HCC) - Plan: QuantiFERON-TB Gold Plus, Hepatitis B core antibody, total, Hepatitis B surface antigen, CBC with Differential/Platelet, Comprehensive metabolic panel, Hepatitis B surface antibody, Hepatitis C antibody  High risk medication use - Plan: QuantiFERON-TB Gold Plus, Hepatitis B core antibody, total, Hepatitis B surface antigen, CBC with Differential/Platelet, Comprehensive metabolic panel, Hepatitis B surface antibody, Hepatitis C antibody   1.   We had a long discussion about her MRI showing an additional lesion.  We discussed 2 options one would be to stay off of a disease modifying therapy and  to recheck an MRI in about a year and go on therapy if additional changes occur.  The second option would be to go ahead and start a disease modifying therapy as she is showing some signs of activity.  She would prefer to start a disease modifying therapy.  She had not tolerated Betaseron in the past and had skin necrosis.  Therefore, she is not a good candidate for any of the other interferons.  In detail, we went over the risks and benefits of Aubagio and Tecfidera.   She is more interested in Aubagio due to the lower incidence of tolerability side effects and no PML.  She signed a service request form and we will check some blood work to make sure that she does not have any chronic infections or liver issues 2.   She will continue her other medications 3.   Continue to be active and exercise as tolerated. 4.    She will return to see me in one year or sooner if there are new or worsening neurologic symptoms.  40 minutes face-to-face evaluation with greater than one half the time counseling coordinating care about her MS disease activity and treatment options.  Tequila Rottmann A. Felecia Shelling, MD,  PhD 10/22/8862, 8:47 PM Certified in Neurology, Clinical Neurophysiology, Sleep Medicine, Pain Medicine and Neuroimaging  [facility] [address], [place], [place] [address] [phone]

## 2017-07-21 NOTE — Telephone Encounter (Signed)
Spoke with Traci Watkins and reviewed below MRI report.  She verbalized understanding of same, would like to have a more detailed discussion about findings and possible need for dmt.  Appt. given--she will come in now./fim

## 2017-07-26 ENCOUNTER — Encounter: Payer: Self-pay | Admitting: *Deleted

## 2017-07-27 ENCOUNTER — Telehealth: Payer: Self-pay | Admitting: *Deleted

## 2017-07-27 NOTE — Telephone Encounter (Signed)
PA for Aubagio 14mg  tablets #90/90 completed via CoverMyMeds. Key# [id number]. Dx: RRMS (G35). Tried and failed meds: Betaseron (intolerance).  Not a candidate for Plegridy, Avonex, Copaxone, Glatiramer, Rebif due to intolerance of injectable meds/skin rxn's.  PA approved for dates 06/27/17 thru 07/27/18.  Case ID: [id number]/CSP

## 2017-07-28 LAB — QUANTIFERON-TB GOLD PLUS
QUANTIFERON NIL VALUE: 0.04 [IU]/mL
QUANTIFERON-TB GOLD PLUS: NEGATIVE
QuantiFERON TB1 Ag Value: 0.06 IU/mL
QuantiFERON TB2 Ag Value: 0.05 IU/mL

## 2017-07-28 LAB — COMPREHENSIVE METABOLIC PANEL
ALK PHOS: 42 IU/L (ref 39–117)
ALT: 27 IU/L (ref 0–32)
AST: 20 IU/L (ref 0–40)
Albumin/Globulin Ratio: 1.9 (ref 1.2–2.2)
Albumin: 4.4 g/dL (ref 3.5–5.5)
BILIRUBIN TOTAL: 0.4 mg/dL (ref 0.0–1.2)
BUN / CREAT RATIO: 13 (ref 9–23)
BUN: 9 mg/dL (ref 6–24)
CHLORIDE: 104 mmol/L (ref 96–106)
CO2: 23 mmol/L (ref 20–29)
Calcium: 9.3 mg/dL (ref 8.7–10.2)
Creatinine, Ser: 0.69 mg/dL (ref 0.57–1.00)
GFR calc non Af Amer: 103 mL/min/{1.73_m2} (ref >59)
GFR, EST AFRICAN AMERICAN: 119 mL/min/{1.73_m2} (ref >59)
Globulin, Total: 2.3 g/dL (ref 1.5–4.5)
Glucose: 87 mg/dL (ref 65–99)
Potassium: 4.4 mmol/L (ref 3.5–5.2)
Sodium: 139 mmol/L (ref 134–144)
TOTAL PROTEIN: 6.7 g/dL (ref 6.0–8.5)

## 2017-07-28 LAB — CBC WITH DIFFERENTIAL/PLATELET
BASOS ABS: 0 10*3/uL (ref 0.0–0.2)
Basos: 1 %
EOS (ABSOLUTE): 0 10*3/uL (ref 0.0–0.4)
Eos: 1 %
Hematocrit: 39.9 % (ref 34.0–46.6)
Hemoglobin: 13.6 g/dL (ref 11.1–15.9)
Immature Grans (Abs): 0 10*3/uL (ref 0.0–0.1)
Immature Granulocytes: 0 %
Lymphocytes Absolute: 1.7 10*3/uL (ref 0.7–3.1)
Lymphs: 28 %
MCH: 29.4 pg (ref 26.6–33.0)
MCHC: 34.1 g/dL (ref 31.5–35.7)
MCV: 86 fL (ref 79–97)
MONOCYTES: 10 %
Monocytes Absolute: 0.6 10*3/uL (ref 0.1–0.9)
NEUTROS ABS: 3.7 10*3/uL (ref 1.4–7.0)
Neutrophils: 60 %
RBC: 4.63 x10E6/uL (ref 3.77–5.28)
RDW: 14.3 % (ref 12.3–15.4)
WBC: 6.1 10*3/uL (ref 3.4–10.8)

## 2017-07-28 LAB — HEPATITIS B SURFACE ANTIGEN: HEP B S AG: NEGATIVE

## 2017-07-28 LAB — HEPATITIS B CORE ANTIBODY, TOTAL: HEP B C TOTAL AB: NEGATIVE

## 2017-07-28 LAB — HEPATITIS B SURFACE ANTIBODY,QUALITATIVE: Hep B Surface Ab, Qual: NONREACTIVE

## 2017-07-28 LAB — HEPATITIS C ANTIBODY: Hep C Virus Ab: <0.1 s/co ratio (ref 0.0–0.9)

## 2017-09-30 ENCOUNTER — Telehealth: Payer: Self-pay | Admitting: *Deleted

## 2017-09-30 MED ORDER — TERIFLUNOMIDE 14 MG PO TABS: 14.0000 mg | ORAL_TABLET | Freq: Every day | ORAL | 3 refills | Status: DC

## 2017-09-30 NOTE — Telephone Encounter (Signed)
Aubagio rx. escribed to [facility] as requested/fim

## 2017-10-07 ENCOUNTER — Other Ambulatory Visit (INDEPENDENT_AMBULATORY_CARE_PROVIDER_SITE_OTHER): Payer: Self-pay

## 2017-10-07 ENCOUNTER — Other Ambulatory Visit: Payer: Self-pay | Admitting: Neurology

## 2017-10-07 DIAGNOSIS — Z79899 Other long term (current) drug therapy: Secondary | ICD-10-CM

## 2017-10-07 DIAGNOSIS — G35 Multiple sclerosis: Secondary | ICD-10-CM | POA: Diagnosis not present

## 2017-10-07 DIAGNOSIS — Z0289 Encounter for other administrative examinations: Secondary | ICD-10-CM

## 2017-10-08 LAB — CBC WITH DIFFERENTIAL/PLATELET
BASOS ABS: 0 10*3/uL (ref 0.0–0.2)
Basos: 1 %
EOS (ABSOLUTE): 0.1 10*3/uL (ref 0.0–0.4)
Eos: 2 %
Hematocrit: 42.5 % (ref 34.0–46.6)
Hemoglobin: 14 g/dL (ref 11.1–15.9)
IMMATURE GRANS (ABS): 0 10*3/uL (ref 0.0–0.1)
IMMATURE GRANULOCYTES: 1 %
LYMPHS: 26 %
Lymphocytes Absolute: 1.2 10*3/uL (ref 0.7–3.1)
MCH: 28.9 pg (ref 26.6–33.0)
MCHC: 32.9 g/dL (ref 31.5–35.7)
MCV: 88 fL (ref 79–97)
MONOCYTES: 11 %
Monocytes Absolute: 0.5 10*3/uL (ref 0.1–0.9)
NEUTROS PCT: 59 %
Neutrophils Absolute: 2.8 10*3/uL (ref 1.4–7.0)
RBC: 4.84 x10E6/uL (ref 3.77–5.28)
RDW: 12.6 % (ref 12.3–15.4)
WBC: 4.6 10*3/uL (ref 3.4–10.8)

## 2017-10-08 LAB — HEPATIC FUNCTION PANEL
ALT: 37 IU/L — ABNORMAL HIGH (ref 0–32)
AST: 26 IU/L (ref 0–40)
Albumin: 4.3 g/dL (ref 3.5–5.5)
Alkaline Phosphatase: 42 IU/L (ref 39–117)
BILIRUBIN TOTAL: 0.6 mg/dL (ref 0.0–1.2)
Bilirubin, Direct: 0.18 mg/dL (ref 0.00–0.40)
Total Protein: 6.8 g/dL (ref 6.0–8.5)

## 2017-10-10 ENCOUNTER — Telehealth: Payer: Self-pay | Admitting: *Deleted

## 2017-10-10 NOTE — Telephone Encounter (Signed)
Left patient a detailed message, with results, on voicemail (ok per DPR).  Provided our number to call back with any questions.  

## 2017-10-10 NOTE — Telephone Encounter (Signed)
-----   Message from Britt Bottom, MD sent at 10/08/2017  2:01 PM EDT ----- Please let the patient know that the lab work is fine.

## 2017-11-18 ENCOUNTER — Other Ambulatory Visit: Payer: Self-pay | Admitting: *Deleted

## 2017-11-18 ENCOUNTER — Telehealth: Payer: Self-pay | Admitting: Neurology

## 2017-11-18 ENCOUNTER — Other Ambulatory Visit: Payer: Self-pay

## 2017-11-18 DIAGNOSIS — H43391 Other vitreous opacities, right eye: Secondary | ICD-10-CM | POA: Diagnosis not present

## 2017-11-18 DIAGNOSIS — G35 Multiple sclerosis: Secondary | ICD-10-CM

## 2017-11-18 DIAGNOSIS — Z79899 Other long term (current) drug therapy: Secondary | ICD-10-CM

## 2017-11-18 NOTE — Telephone Encounter (Signed)
Pt said she has been on aubagio and needs 2nd set of labs done. Pt said last time she came in there were no orders put in, she is wanting a call when this has been done.

## 2017-11-18 NOTE — Telephone Encounter (Signed)
The patient is on Aubagio. She get routine labs for the following:  1) cbc w/ diff/platelets 2) hepatic function panel  Orders placed in Epic and she has been added to the lab schedule.  Returned call to patient and left message letting her know this has been completed.

## 2017-11-30 ENCOUNTER — Ambulatory Visit: Payer: BLUE CROSS/BLUE SHIELD | Admitting: Neurology

## 2017-12-29 ENCOUNTER — Other Ambulatory Visit: Payer: Self-pay | Admitting: Obstetrics and Gynecology

## 2017-12-29 DIAGNOSIS — Z9889 Other specified postprocedural states: Secondary | ICD-10-CM

## 2018-01-02 DIAGNOSIS — S62522B Displaced fracture of distal phalanx of left thumb, initial encounter for open fracture: Secondary | ICD-10-CM | POA: Diagnosis not present

## 2018-01-02 DIAGNOSIS — M79645 Pain in left finger(s): Secondary | ICD-10-CM | POA: Diagnosis not present

## 2018-01-09 DIAGNOSIS — M25642 Stiffness of left hand, not elsewhere classified: Secondary | ICD-10-CM | POA: Diagnosis not present

## 2018-01-10 ENCOUNTER — Other Ambulatory Visit: Payer: Self-pay | Admitting: Neurology

## 2018-01-16 DIAGNOSIS — S62522D Displaced fracture of distal phalanx of left thumb, subsequent encounter for fracture with routine healing: Secondary | ICD-10-CM | POA: Diagnosis not present

## 2018-01-20 ENCOUNTER — Other Ambulatory Visit (INDEPENDENT_AMBULATORY_CARE_PROVIDER_SITE_OTHER): Payer: Self-pay

## 2018-01-20 DIAGNOSIS — Z79899 Other long term (current) drug therapy: Secondary | ICD-10-CM | POA: Diagnosis not present

## 2018-01-20 DIAGNOSIS — M25642 Stiffness of left hand, not elsewhere classified: Secondary | ICD-10-CM | POA: Diagnosis not present

## 2018-01-20 DIAGNOSIS — Z0289 Encounter for other administrative examinations: Secondary | ICD-10-CM

## 2018-01-20 DIAGNOSIS — G35 Multiple sclerosis: Secondary | ICD-10-CM | POA: Diagnosis not present

## 2018-01-21 LAB — CBC WITH DIFFERENTIAL/PLATELET
BASOS ABS: 0.1 10*3/uL (ref 0.0–0.2)
Basos: 2 %
EOS (ABSOLUTE): 0.1 10*3/uL (ref 0.0–0.4)
Eos: 1 %
Hematocrit: 41 % (ref 34.0–46.6)
Hemoglobin: 14 g/dL (ref 11.1–15.9)
IMMATURE GRANULOCYTES: 0 %
Immature Grans (Abs): 0 10*3/uL (ref 0.0–0.1)
Lymphocytes Absolute: 1.6 10*3/uL (ref 0.7–3.1)
Lymphs: 34 %
MCH: 29.5 pg (ref 26.6–33.0)
MCHC: 34.1 g/dL (ref 31.5–35.7)
MCV: 86 fL (ref 79–97)
MONOS ABS: 0.6 10*3/uL (ref 0.1–0.9)
Monocytes: 12 %
NEUTROS PCT: 51 %
Neutrophils Absolute: 2.4 10*3/uL (ref 1.4–7.0)
Platelets: 112 10*3/uL — ABNORMAL LOW (ref 150–450)
RBC: 4.75 x10E6/uL (ref 3.77–5.28)
RDW: 11.9 % — AB (ref 12.3–15.4)
WBC: 4.7 10*3/uL (ref 3.4–10.8)

## 2018-01-21 LAB — HEPATIC FUNCTION PANEL
ALK PHOS: 42 IU/L (ref 39–117)
ALT: 35 IU/L — ABNORMAL HIGH (ref 0–32)
AST: 24 IU/L (ref 0–40)
Albumin: 4.3 g/dL (ref 3.5–5.5)
Bilirubin Total: 0.4 mg/dL (ref 0.0–1.2)
Bilirubin, Direct: 0.15 mg/dL (ref 0.00–0.40)
TOTAL PROTEIN: 6.7 g/dL (ref 6.0–8.5)

## 2018-01-23 ENCOUNTER — Telehealth: Payer: Self-pay | Admitting: *Deleted

## 2018-01-23 ENCOUNTER — Ambulatory Visit
Admission: RE | Admit: 2018-01-23 | Discharge: 2018-01-23 | Disposition: A | Discharge status: Home / Self Care | Payer: BLUE CROSS/BLUE SHIELD | Source: Ambulatory Visit | Attending: Obstetrics and Gynecology | Admitting: Obstetrics and Gynecology

## 2018-01-23 ENCOUNTER — Other Ambulatory Visit: Payer: Self-pay | Admitting: Obstetrics and Gynecology

## 2018-01-23 DIAGNOSIS — Z9889 Other specified postprocedural states: Secondary | ICD-10-CM

## 2018-01-23 DIAGNOSIS — N6320 Unspecified lump in the left breast, unspecified quadrant: Secondary | ICD-10-CM

## 2018-01-23 DIAGNOSIS — Z853 Personal history of malignant neoplasm of breast: Secondary | ICD-10-CM | POA: Diagnosis not present

## 2018-01-23 DIAGNOSIS — R922 Inconclusive mammogram: Secondary | ICD-10-CM | POA: Diagnosis not present

## 2018-01-23 NOTE — Telephone Encounter (Signed)
LMOM (identified vm) with below lab results; call with any questions/fim

## 2018-01-23 NOTE — Telephone Encounter (Signed)
Patient is returning a call. °

## 2018-01-23 NOTE — Telephone Encounter (Signed)
I spoke with Traci Watkins and reviewed lft's with her. She asked if minimal elevation of ALT is d/t Aubagio. I have explained it could be, but also could be d/t other factors--other meds, such as Tylenol that are cleared by the liver, and also ETOH. I have provided reassurance--minimal elevation, no other enzymes are abnormal, and it is lower than it was 3 mos. ago. She verbalized understanding of same, will call back if she has other questions/fim

## 2018-01-23 NOTE — Telephone Encounter (Signed)
-----   Message from Hope Pigeon, RN sent at 01/23/2018  7:37 AM EST -----   ----- Message ----- From: Britt Bottom, MD Sent: 01/22/2018   4:26 PM EST To: Hope Pigeon, RN  Please let the patient know that the lab work is fine.

## 2018-02-09 DIAGNOSIS — Z5189 Encounter for other specified aftercare: Secondary | ICD-10-CM | POA: Diagnosis not present

## 2018-02-09 DIAGNOSIS — M25642 Stiffness of left hand, not elsewhere classified: Secondary | ICD-10-CM | POA: Diagnosis not present

## 2018-02-24 ENCOUNTER — Other Ambulatory Visit: Payer: Self-pay | Admitting: Neurology

## 2018-03-10 ENCOUNTER — Encounter

## 2018-03-10 ENCOUNTER — Encounter: Payer: Self-pay | Admitting: Neurology

## 2018-03-10 ENCOUNTER — Ambulatory Visit (INDEPENDENT_AMBULATORY_CARE_PROVIDER_SITE_OTHER): Payer: BLUE CROSS/BLUE SHIELD | Admitting: Neurology

## 2018-03-10 ENCOUNTER — Telehealth: Payer: Self-pay | Admitting: *Deleted

## 2018-03-10 VITALS — BP 137/92 | HR 95 | Ht 68.0 in | Wt 188.5 lb

## 2018-03-10 DIAGNOSIS — R200 Anesthesia of skin: Secondary | ICD-10-CM | POA: Insufficient documentation

## 2018-03-10 DIAGNOSIS — G35 Multiple sclerosis: Secondary | ICD-10-CM | POA: Diagnosis not present

## 2018-03-10 DIAGNOSIS — Z79899 Other long term (current) drug therapy: Secondary | ICD-10-CM | POA: Diagnosis not present

## 2018-03-10 DIAGNOSIS — Z853 Personal history of malignant neoplasm of breast: Secondary | ICD-10-CM

## 2018-03-10 DIAGNOSIS — R208 Other disturbances of skin sensation: Secondary | ICD-10-CM | POA: Diagnosis not present

## 2018-03-10 NOTE — Progress Notes (Signed)
[facility]  PATIENT: Traci Watkins DOB: 02-Jul-1970  REFERRING DOCTOR OR PCP:  Carlos Levering [facility]) SOURCE: patient and EMR records  _________________________________   HISTORICAL  CHIEF COMPLAINT:  Chief Complaint  Patient presents with  . Multiple Sclerosis    She has continued Aubagio without adverse side effects.  No new MS concerns.      HISTORY OF PRESENT ILLNESS:  Traci Watkins is a 48 y.o. woman with relapsing remitting multiple sclerosis.     Update 03/10/2018: She is having trouble getting her Aubagio.  She is still [employer] but the policy may have changed. She has tolerated it well and had mild headache the first month but no issue well.   She had a little hair loss but it stopped.   Gait is doing well.   She notes some numbness in her left foot x 2 months.    It seems to involve the entire foot and feels cold.  She notes that temperature sensation is reduced.    No weakness or clumsiness.    No change in bladder (has some frequency x years).     Compared to last month, it is unchanged.   She notes a fall last year.    She denies much fatigue and sleeps well most nights.   She may be starting menopause.    Cognition is fine.   Mood is stable.     Her breast cancer is doing well.   She is off all therapy now.     Her hip pain is doing well.       Update 07/21/2017: She had an MRI of the brain 07/19/2017.  It shows one additional focus in the left frontal juxtacortical white matter that was not present on the 09/13/2014 MRI.  The foci is looked unchanged.  I reviewed the MRI images in front of her and her husband.  We compared the MRI to her 2016 MRI.  She has been off of any disease modifying therapy since 2009.  He stopped the beta for due to difficulties with the injections.  She had skin necrosis of these months.  Since that time, she has had 3 or 4 brain MRIs and all of the other MRIs showed stable low lesion load in the brain.  The current MRI is the  first 1 to show changes since she stopped the medication.  We had a long discussion about the significance of the findings in detail discussed several disease modifying therapy.  Update 07/06/2017: The last MRI of the brain was performed 09/16/2014 and showed no new lesions (only 3-4 old ones).   The cervical spine was normal.    She stopped Betaseron in 2009 and has had no further relapse.       She is doing well and denies any difficulty with gait or balance.    Strength and sensation are fine.    She has not had further episodes of left leg dysesthesia.    Vision is doing well.    Bladder function is fine.   Fatigue is doing well despite sleep maintenance insomnia.    She has never taken anything for the insomnia except melatonin a few times years ago.     Mood is doing well and she has less stress than 6 months ago when her husband was getting RadRx and chemotherapy.     She denies cognitive issues.     She reports pain in her left lower back since a fall on her  left side 4 months ago.    Pain does not radiate into leg but it moves some into the hip.   Pain is worse with sitting a long time and with rolling over in bed.    Ice helps some but Ibuprofen has not helped.     She has breast cancer and sees oncology regularly and is in remission x 8-9 and has a mammogram annually.    From 07/02/2016:   MS:     Her MS has been stable for the most part. She had only one exacerbation in the past 10 years. In July 2016 she had a small episode with left leg weakness. The left leg was also involved with her initial exacerbation in 2000. She received several days of IV steroids to 2016. MRI of the brain just showed a couple of MS plaques in the brain MRI of the cervical spine at that time (09/16/2014) did not show any plaques.    Breast Cancer:   She is doing well and her tamoxifen was recently stopped.  She was diagnosed > 5 years ago.    She has a mammography later this year.   She was treated with surgery and  RadRx and tamoxifen.   No classic chemotherapy.    Gait/strength/sensation: She is walking without difficulty. Every now and then, when she is more tired the left leg will seem slightly clumsy but this does not happen often.   She has some left leg dysesthesias and it feels different than the right     Bowel/bladder: She denies any difficulty with bladder or bowel function.  Vision: She denies any MS related visual problems. She has never had optic neuritis or diplopia. There is no eye pain.  Fatigue/sleep. She feels her fatigue is doing better on phentermine.   She gets mild dry mouth and occasional some insomnia.    She feels less exhausted.  She is on phentermine 37.5 mg and also lost a few pounds.      Mood/cognition: She denies any depression.   She has not noted any cognitive issues at work or at home.   She works as a Government social research officer.     Vit D deficiency:  She takes Vit D 5000 U daily.   Her level was 32.4 last year.      FH:  Her cousin was diagnosed with MS at around age 41 last year.  MS history: In 2000 presented with right facial numbness and later that year had left leg numbness. An MRI and lumbar puncture were performed. She was diagnosed with probable multiple sclerosis. One year later and another MRI was performed that showed additional lesions. She then was diagnosed with clinically definite multiple sclerosis. She was placed on the Betaseron she stopped Betaseron around 2009 due to side effects of the medication. Clinically, she had done well with no exacerbations.   At that time, she had had several stable MRIs during the interim additionally, she has had no further clinical relapses. We made an agreement that she will continue to get occasional MRI monitoring to make sure that there was not subclinical progression of her disease. He rlast MRI was Fortunately, several more MRIs over the last 6 years have shown no further disease activity. Additionally, she has had no clinical  relapses and no progression of disability.    REVIEW OF SYSTEMS: Constitutional: No fevers, chills, sweats, or change in appetite Eyes: No visual changes, double vision, eye pain Ear, nose and throat: No hearing  loss, ear pain, nasal congestion, sore throat Cardiovascular: No chest pain, palpitations Respiratory: No shortness of breath at rest or with exertion.   No wheezes GastrointestinaI: No nausea, vomiting, diarrhea, abdominal pain, fecal incontinence Genitourinary: No dysuria, urinary retention or frequency.  No nocturia. Musculoskeletal: No neck pain, back pain Integumentary: No rash, pruritus, skin lesions Neurological: as above Psychiatric: No depression at this time.  Some anxiety Endocrine: No palpitations, diaphoresis, change in appetite, change in weigh or increased thirst Hematologic/Lymphatic: No anemia, purpura, petechiae. Allergic/Immunologic: No itchy/runny eyes, nasal congestion, recent allergic reactions, rashes  ALLERGIES: No Known Allergies  HOME MEDICATIONS:  Current Outpatient Medications:  .  cholecalciferol (VITAMIN D) 1000 units tablet, Take 5,000 Units by mouth daily., Disp: , Rfl:  .  ibuprofen (ADVIL,MOTRIN) 800 MG tablet, Take 800 mg by mouth every 8 (eight) hours as needed., Disp: , Rfl:  .  Multiple Vitamin (MULTIVITAMIN) tablet, Take 1 tablet by mouth daily., Disp: , Rfl:  .  phentermine 37.5 MG capsule, TAKE 1 CAPSULE BY MOUTH EVERY MORNING, Disp: 30 capsule, Rfl: 0 .  Teriflunomide (AUBAGIO) 14 MG TABS, Take 14 mg by mouth daily., Disp: 90 tablet, Rfl: 3  PAST MEDICAL HISTORY: Past Medical History:  Diagnosis Date  . Anemia    during pregnancy  . Anxiety   . Breast cancer ([place]) 03/24/10   /04/29/10 lumpectomt dcis  . MS (multiple sclerosis) ([place])   . Multiple sclerosis ([place])    10 years,tx with interferon  . Personal history of radiation therapy 2012    PAST SURGICAL HISTORY: Past Surgical History:  Procedure Laterality Date  .  BREAST BIOPSY Right 04/03/2010  . BREAST LUMPECTOMY Right 04/29/10  . LAPAROSCOPIC CHOLECYSTECTOMY  07/26/08    FAMILY HISTORY: Family History  Problem Relation Age of Onset  . Cancer Father        bladder  . Aneurysm Father   . Breast cancer Maternal Uncle   . Breast cancer Maternal Aunt     SOCIAL HISTORY:  Social History   Socioeconomic History  . Marital status: Married    Spouse name: Not on file  . Number of children: 1  . Years of education: Not on file  . Highest education level: Not on file  Occupational History  . Occupation: Health visitor: [employer]  Social Needs  . Financial resource strain: Not on file  . Food insecurity:    Worry: Not on file    Inability: Not on file  . Transportation needs:    Medical: Not on file    Non-medical: Not on file  Tobacco Use  . Smoking status: Former Smoker    Packs/day: 0.50    Types: Cigarettes  . Smokeless tobacco: Never Used  Substance and Sexual Activity  . Alcohol use: Yes    Comment: socially  . Drug use: No    Comment: 10 years,quit 2008  . Sexual activity: Not on file  Lifestyle  . Physical activity:    Days per week: Not on file    Minutes per session: Not on file  . Stress: Not on file  Relationships  . Social connections:    Talks on phone: Not on file    Gets together: Not on file    Attends religious service: Not on file    Active member of club or organization: Not on file    Attends meetings of clubs or organizations: Not on file    Relationship status: Not on file  .  Intimate partner violence:    Fear of current or ex partner: Not on file    Emotionally abused: Not on file    Physically abused: Not on file    Forced sexual activity: Not on file  Other Topics Concern  . Not on file  Social History Narrative  . Not on file     PHYSICAL EXAM  Vitals:   03/10/18 0847  BP: (!) 137/92  Pulse: 95  Weight: 188 lb 8 oz (85.5 kg)  Height: 5\' 8"  (1.727 m)    Body mass  index is 28.66 kg/m.   General: The patient is well-developed and well-nourished and in no acute distress.  Only mild lower back tenderness  Neurologic Exam  Mental status: The patient is alert and oriented x 3 at the time of the examination. The patient has apparent normal recent and remote memory, with an apparently normal attention span and concentration ability.   Speech is normal.  Cranial nerves: Extraocular movements are full.  Facial strength is normal.  Trapezius strength is normal.  The tongue is midline, and the patient has symmetric elevation of the soft palate. No obvious hearing deficits are noted.  Motor:  Muscle bulk is normal.   Tone is normal. Strength is 5/5 in all extremities except for 4+/5 in the extensor hallucis longus muscle on the left..   Sensory: She has normal sensation in the arms.. She has mildly reduced temperature and vibration sensation in the left leg compared to the right leg with superimposed further numbness in the L5 distribution of the left foot..   Coordination: Cerebellar testing shows good finger-nose-finger  Gait and station: Station is normal.   Gait is normal.  Tandem gait is mildly wide.  Romberg is negative.  Reflexes: Deep tendon reflexes are symmetric and normal in arms but mildly increased DTR at the left knee.    DIAGNOSTIC DATA (LABS, IMAGING, TESTING) - I reviewed patient records, labs, notes, testing and imaging myself where available.  Lab Results  Component Value Date   WBC 4.7 01/20/2018   HGB 14.0 01/20/2018   HCT 41.0 01/20/2018   MCV 86 01/20/2018   PLT 112 (L) 01/20/2018      Component Value Date/Time   NA 139 07/21/2017 1026   NA 140 05/14/2015 1537   K 4.4 07/21/2017 1026   K 4.1 05/14/2015 1537   CL 104 07/21/2017 1026   CL 107 04/03/2012 0847   CO2 23 07/21/2017 1026   CO2 26 05/14/2015 1537   GLUCOSE 87 07/21/2017 1026   GLUCOSE 114 05/14/2015 1537   GLUCOSE 98 04/03/2012 0847   BUN 9 07/21/2017 1026    BUN 10.7 05/14/2015 1537   CREATININE 0.69 07/21/2017 1026   CREATININE 0.8 05/14/2015 1537   CALCIUM 9.3 07/21/2017 1026   CALCIUM 9.3 05/14/2015 1537   PROT 6.7 01/20/2018 0903   PROT 6.9 05/14/2015 1537   ALBUMIN 4.3 01/20/2018 0903   ALBUMIN 3.8 05/14/2015 1537   AST 24 01/20/2018 0903   AST 14 05/14/2015 1537   ALT 35 (H) 01/20/2018 0903   ALT 15 05/14/2015 1537   ALKPHOS 42 01/20/2018 0903   ALKPHOS 29 (L) 05/14/2015 1537   BILITOT 0.4 01/20/2018 0903   BILITOT 0.39 05/14/2015 1537   GFRNONAA 103 07/21/2017 1026   GFRAA 119 07/21/2017 1026      ASSESSMENT AND PLAN  Multiple sclerosis (HCC)  Hx Breast Cancer (DCIS) Right, Stage 0, receptor +  Dysesthesia  High risk medication use  Numbness of left foot   1.    She is tolerating the Aubagio well.  She is having some difficulty with her insurance.  Sharyn Lull called [employer] prior authorization is good until June.  She does have an $80+ co-pay every 3 months.   2.   She will continue her other medications 3.   The numbness in the left foot is likely a mild L5 radiculopathy as she also has minimal toe extension weakness.  Symptoms are mild.  However, if they persist or worsen we will need to consider imaging.  I will hold off on gabapentin at this point but consider if symptoms worsen continue to be active and exercise as tolerated. 4.    She will return to see me in one year or sooner if there are new or worsening neurologic symptoms.   Richard A. Felecia Shelling, MD, PhD 02/25/5518, 80:22 AM Certified in Neurology, Clinical Neurophysiology, Sleep Medicine, Pain Medicine and Neuroimaging  [facility] [address] [place], [place] [address] [phone]

## 2018-03-10 NOTE — Telephone Encounter (Signed)
Patient came to her appt today with concerns over Aubagio being covered this year.    I saw the previous documentation below in her chart from 07/2017:  PA for Aubagio 14mg  tablets #90/90 completed via CoverMyMeds. Key# [id number]. Dx: RRMS (G35). Tried and failed meds: Betaseron (intolerance).  Not a candidate for Plegridy, Avonex, Copaxone, Glatiramer, Rebif due to intolerance of injectable meds/skin rxn's.  PA approved for dates 06/27/17 thru 07/27/18.  Case ID: [id number]  I called [facility] [phone]) and they confirmed that her medication authorization is valid through 07/27/2018.  He ran test claims to make sure the prescription would pay with benefits.  He did state it is not a preferred drug this year but it was eligible for a coverage override.  Stated we would just need to call for a new prior authorization when her current one expires.  I called to patient to update her with this information.

## 2018-03-28 ENCOUNTER — Other Ambulatory Visit: Payer: Self-pay | Admitting: Neurology

## 2018-06-14 ENCOUNTER — Telehealth: Payer: Self-pay | Admitting: Neurology

## 2018-06-14 ENCOUNTER — Other Ambulatory Visit: Payer: Self-pay

## 2018-06-14 ENCOUNTER — Encounter: Payer: Self-pay | Admitting: Neurology

## 2018-06-14 ENCOUNTER — Ambulatory Visit (INDEPENDENT_AMBULATORY_CARE_PROVIDER_SITE_OTHER): Payer: BLUE CROSS/BLUE SHIELD | Admitting: Neurology

## 2018-06-14 DIAGNOSIS — Z853 Personal history of malignant neoplasm of breast: Secondary | ICD-10-CM | POA: Diagnosis not present

## 2018-06-14 DIAGNOSIS — R208 Other disturbances of skin sensation: Secondary | ICD-10-CM | POA: Diagnosis not present

## 2018-06-14 DIAGNOSIS — G35 Multiple sclerosis: Secondary | ICD-10-CM | POA: Diagnosis not present

## 2018-06-14 DIAGNOSIS — G4700 Insomnia, unspecified: Secondary | ICD-10-CM | POA: Diagnosis not present

## 2018-06-14 DIAGNOSIS — R200 Anesthesia of skin: Secondary | ICD-10-CM

## 2018-06-14 MED ORDER — TRAZODONE HCL 50 MG PO TABS: 50.0000 mg | ORAL_TABLET | Freq: Every day | ORAL | 5 refills | Status: DC

## 2018-06-14 MED ORDER — METHYLPREDNISOLONE 4 MG PO TABS: ORAL_TABLET | ORAL | 0 refills | Status: DC

## 2018-06-14 NOTE — Telephone Encounter (Signed)
I called pt back. She was agreeable to schedule VV with Dr. Felecia Shelling. She has her own VV today she is doing and could not do the 330pm appt. Accepted the 4pm. I scheduled/emailed her link for VV. She will call back if she does not receive this. I updated med list/pharmacy/allergies on file. Email: [email or web address]

## 2018-06-14 NOTE — Addendum Note (Signed)
Addended by: Hope Pigeon on: 06/14/2018 10:29 AM   Modules accepted: Orders

## 2018-06-14 NOTE — Telephone Encounter (Signed)
Patient she last saw Dr. Felecia Shelling in January and she was having numbness in her left foot she sated her numbness has now moved over to her right leg. She would like a call back on her cell phone at [phone]

## 2018-06-14 NOTE — Progress Notes (Signed)
[facility]  PATIENT: Traci Watkins DOB: 09/27/1970  REFERRING DOCTOR OR PCP:  Carlos Levering [facility]) SOURCE: patient and EMR records  _________________________________   HISTORICAL  CHIEF COMPLAINT:  Chief Complaint  Patient presents with   Multiple Sclerosis    HISTORY OF PRESENT ILLNESS:  Traci Watkins is a 48 y.o. woman with relapsing remitting multiple sclerosis.     Update 06/14/2018: Virtual Visit via Video Note I connected with Traci Watkins on 06/14/18 at  4:00 PM EDT by a video enabled telemedicine application and verified that I am speaking with the correct person.  I discussed the limitations of evaluation and management by telemedicine and the availability of in person appointments. The patient expressed understanding and agreed to proceed.  History of Present Illness: She is on Aubagio for her MS (in 2019 she had an MRI showing a new lesion not present on previous MRI, promptly her to go back on a disease modifying therapy).   She was experiencing numbness in her left foot at the last visit.   The left foot is slightly more than the last visit but the right foot is now involved .  On the left, numbness is from the lower shin down.   On the right, numbness is in the great toe and adjacent foot.  There is tingling but not painful.  No radicular symptoms.  She has mild weakness in the left toes but not in the ankle.   She has no change in gait.  She has a little bit of urinary frequency at times but this is not worse over the past few months.  Vision is fine.  She has less fatigue with the phentermine and is working full-time.  She is able to socially distance at work.  She has had more insomnia.  This appears to be sleep onset more than sleep maintenance.  Mood is doing well.  She has breast cancer and recent mammogram showed no recurrence.   Observations/Objective: She is a well-developed well-nourished woman in no acute distress.  The head is  normocephalic and atraumatic.  Sclera are anicteric.  Visible skin appears normal.  The neck has a good range of motion.  She is alert and fully oriented with fluent speech and good attention, knowledge and memory.  Extraocular muscles are intact.  Facial strength is normal.  Palatal elevation and tongue protrusion are midline.    She appears to have normal strength in the arms.  Rapid alternating movements and finger-nose-finger are performed well.   She is able to stand on her toes with either leg.  She has reduced ability to extend toes on the left.  Walks without foot drop.  She has reduced sensation in an L5/peroneal distribution more than S1/tibial distribution on the left and some numbness near right great toe to touch.   Sural sensation ok.    Assessment and Plan: Multiple sclerosis (HCC)  Numbness of left foot  Hx Breast Cancer (DCIS) Right, Stage 0, receptor +  Dysesthesia  Insomnia, unspecified type  1.   Continue Aubagio. 2.   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  Radiculopathy or MS could also cause these changes.  I will have her take a steroid pack to see if that helps the numbness.  If she is no better in 2 weeks or if she progresses any she should call us back and I will schedule a nerve conduction and  EMG study. 3.    Stay active and exercise as tolerated.  Practice CDC guidelines to help reduce the chance of contracting COVID-19 4.    Trazodone for sleep.  She can take 2 pills if needed.  Initially she should take the medication 1 hour before bedtime and move it up further if she wakes up groggy.   5.   Return for nerve conduction study if symptoms worsen or as scheduled in a few months for regular follow-up visit.  Follow Up Instructions: I discussed the assessment and treatment plan with the patient. The patient was provided an opportunity to ask questions and all were answered. The patient  agreed with the plan and demonstrated an understanding of the instructions.    The patient was advised to call back or seek an in-person evaluation if the symptoms worsen or if the condition fails to improve as anticipated.  I provided 30 minutes of non-face-to-face time during this encounter.    Update 03/10/2018: She is having trouble getting her Aubagio.  She is still [employer] but the policy may have changed. She has tolerated it well and had mild headache the first month but no issue well.   She had a little hair loss but it stopped.   Gait is doing well.   She notes some numbness in her left foot x 2 months.    It seems to involve the entire foot and feels cold.  She notes that temperature sensation is reduced.    No weakness or clumsiness.    No change in bladder (has some frequency x years).     Compared to last month, it is unchanged.   She notes a fall last year.    She denies much fatigue and sleeps well most nights.   She may be starting menopause.    Cognition is fine.   Mood is stable.     Her breast cancer is doing well.   She is off all therapy now.     Her hip pain is doing well.       Update 07/21/2017: She had an MRI of the brain 07/19/2017.  It shows one additional focus in the left frontal juxtacortical white matter that was not present on the 09/13/2014 MRI.  The foci is looked unchanged.  I reviewed the MRI images in front of her and her husband.  We compared the MRI to her 2016 MRI.  She has been off of any disease modifying therapy since 2009.  He stopped the beta for due to difficulties with the injections.  She had skin necrosis of these months.  Since that time, she has had 3 or 4 brain MRIs and all of the other MRIs showed stable low lesion load in the brain.  The current MRI is the first 1 to show changes since she stopped the medication.  We had a long discussion about the significance of the findings in detail discussed several disease modifying therapy.  Update  07/06/2017: The last MRI of the brain was performed 09/16/2014 and showed no new lesions (only 3-4 old ones).   The cervical spine was normal.    She stopped Betaseron in 2009 and has had no further relapse.       She is doing well and denies any difficulty with gait or balance.    Strength and sensation are fine.    She has not had further episodes of left leg dysesthesia.    Vision is doing well.  Bladder function is fine.   Fatigue is doing well despite sleep maintenance insomnia.    She has never taken anything for the insomnia except melatonin a few times years ago.     Mood is doing well and she has less stress than 6 months ago when her husband was getting RadRx and chemotherapy.     She denies cognitive issues.     She reports pain in her left lower back since a fall on her left side 4 months ago.    Pain does not radiate into leg but it moves some into the hip.   Pain is worse with sitting a long time and with rolling over in bed.    Ice helps some but Ibuprofen has not helped.     She has breast cancer and sees oncology regularly and is in remission x 8-9 and has a mammogram annually.    From 07/02/2016:   MS:     Her MS has been stable for the most part. She had only one exacerbation in the past 10 years. In July 2016 she had a small episode with left leg weakness. The left leg was also involved with her initial exacerbation in 2000. She received several days of IV steroids to 2016. MRI of the brain just showed a couple of MS plaques in the brain MRI of the cervical spine at that time (09/16/2014) did not show any plaques.    Breast Cancer:   She is doing well and her tamoxifen was recently stopped.  She was diagnosed > 5 years ago.    She has a mammography later this year.   She was treated with surgery and RadRx and tamoxifen.   No classic chemotherapy.    Gait/strength/sensation: She is walking without difficulty. Every now and then, when she is more tired the left leg will seem slightly  clumsy but this does not happen often.   She has some left leg dysesthesias and it feels different than the right     Bowel/bladder: She denies any difficulty with bladder or bowel function.  Vision: She denies any MS related visual problems. She has never had optic neuritis or diplopia. There is no eye pain.  Fatigue/sleep. She feels her fatigue is doing better on phentermine.   She gets mild dry mouth and occasional some insomnia.    She feels less exhausted.  She is on phentermine 37.5 mg and also lost a few pounds.      Mood/cognition: She denies any depression.   She has not noted any cognitive issues at work or at home.   She works as a Government social research officer.     Vit D deficiency:  She takes Vit D 5000 U daily.   Her level was 32.4 last year.      FH:  Her cousin was diagnosed with MS at around age 48 last year.  MS history: In 2000 presented with right facial numbness and later that year had left leg numbness. An MRI and lumbar puncture were performed. She was diagnosed with probable multiple sclerosis. One year later and another MRI was performed that showed additional lesions. She then was diagnosed with clinically definite multiple sclerosis. She was placed on the Betaseron she stopped Betaseron around 2009 due to side effects of the medication. Clinically, she had done well with no exacerbations.   At that time, she had had several stable MRIs during the interim additionally, she has had no further clinical relapses. We made an agreement  that she will continue to get occasional MRI monitoring to make sure that there was not subclinical progression of her disease. He rlast MRI was Fortunately, several more MRIs over the last 6 years have shown no further disease activity. Additionally, she has had no clinical relapses and no progression of disability.    REVIEW OF SYSTEMS: Constitutional: No fevers, chills, sweats, or change in appetite Eyes: No visual changes, double vision, eye pain Ear, nose  and throat: No hearing loss, ear pain, nasal congestion, sore throat Cardiovascular: No chest pain, palpitations Respiratory: No shortness of breath at rest or with exertion.   No wheezes GastrointestinaI: No nausea, vomiting, diarrhea, abdominal pain, fecal incontinence Genitourinary: No dysuria, urinary retention or frequency.  No nocturia. Musculoskeletal: No neck pain, back pain Integumentary: No rash, pruritus, skin lesions Neurological: as above Psychiatric: No depression at this time.  Some anxiety Endocrine: No palpitations, diaphoresis, change in appetite, change in weigh or increased thirst Hematologic/Lymphatic: No anemia, purpura, petechiae. Allergic/Immunologic: No itchy/runny eyes, nasal congestion, recent allergic reactions, rashes  ALLERGIES: No Known Allergies  HOME MEDICATIONS:  Current Outpatient Medications:    cholecalciferol (VITAMIN D) 1000 units tablet, Take 5,000 Units by mouth daily., Disp: , Rfl:    ibuprofen (ADVIL) 800 MG tablet, Take 800 mg by mouth every 8 (eight) hours as needed., Disp: , Rfl:    methylPREDNISolone (MEDROL) 4 MG tablet, 6 pills po x 1 day, then 5 pills po x 1 day, then 4 pills po x 1 day, then 3 pills po x 1 day, then 2 pills po x 1 day, then 1 pill po x 1 day, Disp: 21 tablet, Rfl: 0   Multiple Vitamin (MULTIVITAMIN) tablet, Take 1 tablet by mouth daily., Disp: , Rfl:    Multiple Vitamins-Minerals (ZINC PO), Take 1 Dose by mouth daily., Disp: , Rfl:    phentermine 37.5 MG capsule, TAKE 1 CAPSULE BY MOUTH EVERY MORNING, Disp: 30 capsule, Rfl: 5   Teriflunomide (AUBAGIO) 14 MG TABS, Take 14 mg by mouth daily., Disp: 90 tablet, Rfl: 3   traZODone (DESYREL) 50 MG tablet, Take 1 tablet (50 mg total) by mouth at bedtime., Disp: 30 tablet, Rfl: 5  PAST MEDICAL HISTORY: Past Medical History:  Diagnosis Date   Anemia    during pregnancy   Anxiety    Breast cancer ([place]) 03/24/10   /04/29/10 lumpectomt dcis   MS (multiple  sclerosis) ([place])    Multiple sclerosis ([place])    10 years,tx with interferon   Personal history of radiation therapy 2012    PAST SURGICAL HISTORY: Past Surgical History:  Procedure Laterality Date   BREAST BIOPSY Right 04/03/2010   BREAST LUMPECTOMY Right 04/29/10   LAPAROSCOPIC CHOLECYSTECTOMY  07/26/08    FAMILY HISTORY: Family History  Problem Relation Age of Onset   Cancer Father        bladder   Aneurysm Father    Breast cancer Maternal Uncle    Breast cancer Maternal Aunt     SOCIAL HISTORY:  Social History   Socioeconomic History   Marital status: Married    Spouse name: Not on file   Number of children: 1   Years of education: Not on file   Highest education level: Not on file  Occupational History   Occupation: Health visitor: [employer]  Social Needs   Financial resource strain: Not on file   Food insecurity:    Worry: Not on file    Inability: Not on  file   Transportation needs:    Medical: Not on file    Non-medical: Not on file  Tobacco Use   Smoking status: Former Smoker    Packs/day: 0.50    Types: Cigarettes   Smokeless tobacco: Never Used  Substance and Sexual Activity   Alcohol use: Yes    Comment: socially   Drug use: No    Comment: 10 years,quit 2008   Sexual activity: Not on file  Lifestyle   Physical activity:    Days per week: Not on file    Minutes per session: Not on file   Stress: Not on file  Relationships   Social connections:    Talks on phone: Not on file    Gets together: Not on file    Attends religious service: Not on file    Active member of club or organization: Not on file    Attends meetings of clubs or organizations: Not on file    Relationship status: Not on file   Intimate partner violence:    Fear of current or ex partner: Not on file    Emotionally abused: Not on file    Physically abused: Not on file    Forced sexual activity: Not on file  Other Topics Concern    Not on file  Social History Narrative   Not on file     PHYSICAL EXAM  There were no vitals filed for this visit.  There is no height or weight on file to calculate BMI.   General: The patient is well-developed and well-nourished and in no acute distress.  Only mild lower back tenderness  Neurologic Exam  Mental status: The patient is alert and oriented x 3 at the time of the examination. The patient has apparent normal recent and remote memory, with an apparently normal attention span and concentration ability.   Speech is normal.  Cranial nerves: Extraocular movements are full.  Facial strength is normal.  Trapezius strength is normal.  The tongue is midline, and the patient has symmetric elevation of the soft palate. No obvious hearing deficits are noted.  Motor:  Muscle bulk is normal.   Tone is normal. Strength is 5/5 in all extremities except for 4+/5 in the extensor hallucis longus muscle on the left..     Sensory: She has normal sensation in the arms.. She has mildly reduced temperature and vibration sensation in the left leg compared to the right leg with superimposed further numbness in the L5 distribution of the left foot..   Coordination: Cerebellar testing shows good finger-nose-finger  Gait and station: Station is normal.   Gait is normal.  Tandem gait is mildly wide.  Romberg is negative.  Reflexes: Deep tendon reflexes are symmetric and normal in arms but mildly increased DTR at the left knee.    DIAGNOSTIC DATA (LABS, IMAGING, TESTING) - I reviewed patient records, labs, notes, testing and imaging myself where available.  Lab Results  Component Value Date   WBC 4.7 01/20/2018   HGB 14.0 01/20/2018   HCT 41.0 01/20/2018   MCV 86 01/20/2018   PLT 112 (L) 01/20/2018      Component Value Date/Time   NA 139 07/21/2017 1026   NA 140 05/14/2015 1537   K 4.4 07/21/2017 1026   K 4.1 05/14/2015 1537   CL 104 07/21/2017 1026   CL 107 04/03/2012 0847    CO2 23 07/21/2017 1026   CO2 26 05/14/2015 1537   GLUCOSE 87 07/21/2017 1026  GLUCOSE 114 05/14/2015 1537   GLUCOSE 98 04/03/2012 0847   BUN 9 07/21/2017 1026   BUN 10.7 05/14/2015 1537   CREATININE 0.69 07/21/2017 1026   CREATININE 0.8 05/14/2015 1537   CALCIUM 9.3 07/21/2017 1026   CALCIUM 9.3 05/14/2015 1537   PROT 6.7 01/20/2018 0903   PROT 6.9 05/14/2015 1537   ALBUMIN 4.3 01/20/2018 0903   ALBUMIN 3.8 05/14/2015 1537   AST 24 01/20/2018 0903   AST 14 05/14/2015 1537   ALT 35 (H) 01/20/2018 0903   ALT 15 05/14/2015 1537   ALKPHOS 42 01/20/2018 0903   ALKPHOS 29 (L) 05/14/2015 1537   BILITOT 0.4 01/20/2018 0903   BILITOT 0.39 05/14/2015 1537   GFRNONAA 103 07/21/2017 1026   GFRAA 119 07/21/2017 1026      ASSESSMENT AND PLAN  Multiple sclerosis (HCC)  Numbness of left foot  Hx Breast Cancer (DCIS) Right, Stage 0, receptor +  Dysesthesia  Insomnia, unspecified type  Seydina Holliman A. Felecia Shelling, MD, PhD 4/91/7915, 0:56 PM Certified in Neurology, Clinical Neurophysiology, Sleep Medicine, Pain Medicine and Neuroimaging  [facility] [address] [place], [place] [address] [phone]

## 2018-07-12 ENCOUNTER — Ambulatory Visit: Payer: BLUE CROSS/BLUE SHIELD | Admitting: Neurology

## 2018-08-03 ENCOUNTER — Telehealth: Payer: Self-pay | Admitting: *Deleted

## 2018-08-03 NOTE — Telephone Encounter (Signed)
Initiated PA Aubagio on CMM. Key: [id number] - PA Case ID: [id number]. In process of completing.

## 2018-08-03 NOTE — Telephone Encounter (Signed)
PA approved 07/04/18-08/03/19. [id number]

## 2018-08-03 NOTE — Telephone Encounter (Signed)
Submitted PA. Waiting on determination. °

## 2018-08-30 ENCOUNTER — Other Ambulatory Visit: Payer: Self-pay | Admitting: Neurology

## 2018-09-08 ENCOUNTER — Ambulatory Visit: Payer: BLUE CROSS/BLUE SHIELD | Admitting: Neurology

## 2018-09-11 ENCOUNTER — Ambulatory Visit: Payer: BC Managed Care – PPO | Admitting: Neurology

## 2018-09-13 ENCOUNTER — Encounter: Payer: Self-pay | Admitting: Neurology

## 2018-09-13 ENCOUNTER — Ambulatory Visit (INDEPENDENT_AMBULATORY_CARE_PROVIDER_SITE_OTHER): Payer: BC Managed Care – PPO | Admitting: Neurology

## 2018-09-13 VITALS — BP 118/80 | HR 99 | Temp 97.3°F | Wt 191.5 lb

## 2018-09-13 DIAGNOSIS — G35 Multiple sclerosis: Secondary | ICD-10-CM

## 2018-09-13 DIAGNOSIS — G4700 Insomnia, unspecified: Secondary | ICD-10-CM | POA: Diagnosis not present

## 2018-09-13 DIAGNOSIS — C50911 Malignant neoplasm of unspecified site of right female breast: Secondary | ICD-10-CM

## 2018-09-13 DIAGNOSIS — R208 Other disturbances of skin sensation: Secondary | ICD-10-CM

## 2018-09-13 NOTE — Progress Notes (Signed)
[facility]  PATIENT: Traci Watkins DOB: 23-Aug-1970  REFERRING DOCTOR OR PCP:  Carlos Levering [facility]) SOURCE: patient and EMR records  _________________________________   HISTORICAL  CHIEF COMPLAINT:  Chief Complaint  Patient presents with  . Follow-up    RM 13, alone. Last seen 06/14/18. Still having numbness in feet. Has mentioned this before, about the same.   . Multiple Sclerosis    On Aubagio. Takes trazodone for sleep as needed.     HISTORY OF PRESENT ILLNESS:  Traci Watkins is a 48 y.o. woman with relapsing remitting multiple sclerosis.     Update 09/13/18: She is doing well on Aubagio with no new symptoms.   She is still experiencing numbness in her feet.   Initially, only the left foot was having numbness but now both are.    She sometimes has numbness in her hands that is milder.   Sensory changes are mostly the top of the feet, and first 3-4 toes.   The great toe  is mildly weak.    Symptoms started in December and have been stable the last few months.  She has no recent trauma but she recalls a fall landing on the one hip while at a restaurant 2 years ago.  Gait is fine.   Balance is slightly off (old).    Bladder function is stable.   She had a detached retina and vision on the left is still affected.     She is sleeping ok most nights and rarely takes trazodone.    She notes some fatigue in the afternoons.    She is working daily but is able to socially distance well at work.  She has breast cancer (2012) but has not had any recurrence.  She follows up next month. (saw Dr. Marylou Flesher)  Update 06/14/2018: She is on Aubagio for her MS (in 2019 she had an MRI showing a new lesion not present on previous MRI, promptly her to go back on a disease modifying therapy).   She was experiencing numbness in her left foot at the last visit.   The left foot is slightly more than the last visit but the right foot is now involved .  On the left, numbness  is from the lower shin down.   On the right, numbness is in the great toe and adjacent foot.  There is tingling but not painful.  No radicular symptoms.  She has mild weakness in the left toes but not in the ankle.   She has no change in gait.  She has a little bit of urinary frequency at times but this is not worse over the past few months.  Vision is fine.  She has less fatigue with the phentermine and is working full-time.  She is able to socially distance at work.  She has had more insomnia.  This appears to be sleep onset more than sleep maintenance.  Mood is doing well.  She has breast cancer and recent mammogram showed no recurrence.   Observations/Objective: She is a well-developed well-nourished woman in no acute distress.  The head is normocephalic and atraumatic.  Sclera are anicteric.  Visible skin appears normal.  The neck has a good range of motion.  She is alert and fully oriented with fluent speech and good attention, knowledge and memory.  Extraocular muscles are intact.  Facial strength is normal.  Palatal elevation and tongue protrusion are midline.    She appears to have normal strength in the arms.  Rapid alternating movements and finger-nose-finger are performed well.   She is able to stand on her toes with either leg.  She has reduced ability to extend toes on the left.  Walks without foot drop.  She has reduced sensation in an L5/peroneal distribution more than S1/tibial distribution on the left and some numbness near right great toe to touch.   Sural sensation ok.    Assessment and Plan: No diagnosis found.  1.   Continue Aubagio. 2.   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  Radiculopathy or MS could also cause these changes.  I will have her take a steroid pack to see if that helps the numbness.  If she is no better in 2 weeks or if she progresses any she should call us back and I will schedule  a nerve conduction and EMG study. 3.    Stay active and exercise as tolerated.  Practice CDC guidelines to help reduce the chance of contracting COVID-19 4.    Trazodone for sleep.  She can take 2 pills if needed.  Initially she should take the medication 1 hour before bedtime and move it up further if she wakes up groggy.   5.   Return for nerve conduction study if symptoms worsen or as scheduled in a few months for regular follow-up visit.  Follow Up Instructions: I discussed the assessment and treatment plan with the patient. The patient was provided an opportunity to ask questions and all were answered. The patient agreed with the plan and demonstrated an understanding of the instructions.    The patient was advised to call back or seek an in-person evaluation if the symptoms worsen or if the condition fails to improve as anticipated.  I provided 30 minutes of non-face-to-face time during this encounter.    Update 03/10/2018: She is having trouble getting her Aubagio.  She is still [employer] but the policy may have changed. She has tolerated it well and had mild headache the first month but no issue well.   She had a little hair loss but it stopped.   Gait is doing well.   She notes some numbness in her left foot x 2 months.    It seems to involve the entire foot and feels cold.  She notes that temperature sensation is reduced.    No weakness or clumsiness.    No change in bladder (has some frequency x years).     Compared to last month, it is unchanged.   She notes a fall last year.    She denies much fatigue and sleeps well most nights.   She may be starting menopause.    Cognition is fine.   Mood is stable.     Her breast cancer is doing well.   She is off all therapy now.     Her hip pain is doing well.       Update 07/21/2017: She had an MRI of the brain 07/19/2017.  It shows one additional focus in the left frontal juxtacortical white matter that was not present on the 09/13/2014 MRI.  The foci  is looked unchanged.  I reviewed the MRI images in front of her and her husband.  We compared the MRI to her 2016 MRI.  She has been off of any disease modifying therapy since 2009.  He stopped the beta for due to difficulties with the injections.  She had  skin necrosis of these months.  Since that time, she has had 3 or 4 brain MRIs and all of the other MRIs showed stable low lesion load in the brain.  The current MRI is the first 1 to show changes since she stopped the medication.  We had a long discussion about the significance of the findings in detail discussed several disease modifying therapy.  Update 07/06/2017: The last MRI of the brain was performed 09/16/2014 and showed no new lesions (only 3-4 old ones).   The cervical spine was normal.    She stopped Betaseron in 2009 and has had no further relapse.       She is doing well and denies any difficulty with gait or balance.    Strength and sensation are fine.    She has not had further episodes of left leg dysesthesia.    Vision is doing well.    Bladder function is fine.   Fatigue is doing well despite sleep maintenance insomnia.    She has never taken anything for the insomnia except melatonin a few times years ago.     Mood is doing well and she has less stress than 6 months ago when her husband was getting RadRx and chemotherapy.     She denies cognitive issues.     She reports pain in her left lower back since a fall on her left side 4 months ago.    Pain does not radiate into leg but it moves some into the hip.   Pain is worse with sitting a long time and with rolling over in bed.    Ice helps some but Ibuprofen has not helped.     She has breast cancer and sees oncology regularly and is in remission x 8-9 and has a mammogram annually.    From 07/02/2016:   MS:     Her MS has been stable for the most part. She had only one exacerbation in the past 10 years. In July 2016 she had a small episode with left leg weakness. The left leg was also  involved with her initial exacerbation in 2000. She received several days of IV steroids to 2016. MRI of the brain just showed a couple of MS plaques in the brain MRI of the cervical spine at that time (09/16/2014) did not show any plaques.    Breast Cancer:   She is doing well and her tamoxifen was recently stopped.  She was diagnosed > 5 years ago.    She has a mammography later this year.   She was treated with surgery and RadRx and tamoxifen.   No classic chemotherapy.    Gait/strength/sensation: She is walking without difficulty. Every now and then, when she is more tired the left leg will seem slightly clumsy but this does not happen often.   She has some left leg dysesthesias and it feels different than the right     Bowel/bladder: She denies any difficulty with bladder or bowel function.  Vision: She denies any MS related visual problems. She has never had optic neuritis or diplopia. There is no eye pain.  Fatigue/sleep. She feels her fatigue is doing better on phentermine.   She gets mild dry mouth and occasional some insomnia.    She feels less exhausted.  She is on phentermine 37.5 mg and also lost a few pounds.      Mood/cognition: She denies any depression.   She has not noted any cognitive issues at work or  at home.   She works as a Government social research officer.     Vit D deficiency:  She takes Vit D 5000 U daily.   Her level was 32.4 last year.      FH:  Her cousin was diagnosed with MS at around age 56 last year.  MS history: In 2000 presented with right facial numbness and later that year had left leg numbness. An MRI and lumbar puncture were performed. She was diagnosed with probable multiple sclerosis. One year later and another MRI was performed that showed additional lesions. She then was diagnosed with clinically definite multiple sclerosis. She was placed on the Betaseron she stopped Betaseron around 2009 due to side effects of the medication. Clinically, she had done well with no  exacerbations.   At that time, she had had several stable MRIs during the interim additionally, she has had no further clinical relapses. We made an agreement that she will continue to get occasional MRI monitoring to make sure that there was not subclinical progression of her disease. He rlast MRI was Fortunately, several more MRIs over the last 6 years have shown no further disease activity. Additionally, she has had no clinical relapses and no progression of disability.    REVIEW OF SYSTEMS: Constitutional: No fevers, chills, sweats, or change in appetite Eyes: No visual changes, double vision, eye pain Ear, nose and throat: No hearing loss, ear pain, nasal congestion, sore throat Cardiovascular: No chest pain, palpitations Respiratory: No shortness of breath at rest or with exertion.   No wheezes GastrointestinaI: No nausea, vomiting, diarrhea, abdominal pain, fecal incontinence Genitourinary: No dysuria, urinary retention or frequency.  No nocturia. Musculoskeletal: No neck pain, back pain Integumentary: No rash, pruritus, skin lesions Neurological: as above Psychiatric: No depression at this time.  Some anxiety Endocrine: No palpitations, diaphoresis, change in appetite, change in weigh or increased thirst Hematologic/Lymphatic: No anemia, purpura, petechiae. Allergic/Immunologic: No itchy/runny eyes, nasal congestion, recent allergic reactions, rashes  ALLERGIES: No Known Allergies  HOME MEDICATIONS:  Current Outpatient Medications:  .  AUBAGIO 14 MG TABS, TAKE 1 TABLET DAILY, Disp: 90 tablet, Rfl: 3 .  cholecalciferol (VITAMIN D) 1000 units tablet, Take 5,000 Units by mouth daily., Disp: , Rfl:  .  ibuprofen (ADVIL) 800 MG tablet, Take 800 mg by mouth every 8 (eight) hours as needed., Disp: , Rfl:  .  Multiple Vitamin (MULTIVITAMIN) tablet, Take 1 tablet by mouth daily., Disp: , Rfl:  .  Multiple Vitamins-Minerals (ZINC PO), Take 1 Dose by mouth daily., Disp: , Rfl:  .   phentermine 37.5 MG capsule, TAKE 1 CAPSULE BY MOUTH EVERY MORNING, Disp: 30 capsule, Rfl: 5 .  traZODone (DESYREL) 50 MG tablet, Take 1 tablet (50 mg total) by mouth at bedtime. (Patient taking differently: Take 50 mg by mouth at bedtime as needed. ), Disp: 30 tablet, Rfl: 5  PAST MEDICAL HISTORY: Past Medical History:  Diagnosis Date  . Anemia    during pregnancy  . Anxiety   . Breast cancer ([place]) 03/24/10   /04/29/10 lumpectomt dcis  . MS (multiple sclerosis) ([place])   . Multiple sclerosis ([place])    10 years,tx with interferon  . Personal history of radiation therapy 2012    PAST SURGICAL HISTORY: Past Surgical History:  Procedure Laterality Date  . BREAST BIOPSY Right 04/03/2010  . BREAST LUMPECTOMY Right 04/29/10  . LAPAROSCOPIC CHOLECYSTECTOMY  07/26/08    FAMILY HISTORY: Family History  Problem Relation Age of Onset  . Cancer Father  bladder  . Aneurysm Father   . Breast cancer Maternal Uncle   . Breast cancer Maternal Aunt     SOCIAL HISTORY:  Social History   Socioeconomic History  . Marital status: Married    Spouse name: Not on file  . Number of children: 1  . Years of education: Not on file  . Highest education level: Not on file  Occupational History  . Occupation: Health visitor: [employer]  Social Needs  . Financial resource strain: Not on file  . Food insecurity    Worry: Not on file    Inability: Not on file  . Transportation needs    Medical: Not on file    Non-medical: Not on file  Tobacco Use  . Smoking status: Former Smoker    Packs/day: 0.50    Types: Cigarettes  . Smokeless tobacco: Never Used  Substance and Sexual Activity  . Alcohol use: Yes    Comment: socially  . Drug use: No    Comment: 10 years,quit 2008  . Sexual activity: Not on file  Lifestyle  . Physical activity    Days per week: Not on file    Minutes per session: Not on file  . Stress: Not on file  Relationships  . Social Herbalist  on phone: Not on file    Gets together: Not on file    Attends religious service: Not on file    Active member of club or organization: Not on file    Attends meetings of clubs or organizations: Not on file    Relationship status: Not on file  . Intimate partner violence    Fear of current or ex partner: Not on file    Emotionally abused: Not on file    Physically abused: Not on file    Forced sexual activity: Not on file  Other Topics Concern  . Not on file  Social History Narrative  . Not on file     PHYSICAL EXAM  Vitals:   09/13/18 0917  BP: 118/80  Pulse: 99  Temp: (!) 97.3 F (36.3 C)  SpO2: 98%  Weight: 191 lb 8 oz (86.9 kg)    Body mass index is 29.12 kg/m.   General: The patient is well-developed and well-nourished and in no acute distress.  Only mild lower back tenderness  Neurologic Exam  Mental status: The patient is alert and oriented x 3 at the time of the examination. The patient has apparent normal recent and remote memory, with an apparently normal attention span and concentration ability.   Speech is normal.  Cranial nerves: Extraocular movements are full.  Facial strength is normal.  Trapezius strength is normal.  The tongue is midline, and the patient has symmetric elevation of the soft palate. No obvious hearing deficits are noted.  Motor:  Muscle bulk is normal.   Tone is normal. Strength is 5/5 in all extremities except for 4+/5 in the extensor hallucis longus muscle on the left.  Functional testing shows slight left ankle dipping when standing on the heels.  Sensory: She has normal sensation in the arms.. There is slightly reduced sensation in the lower leg on the left relative to the right to touch and temperature.  Superimposed on this is worse sensation in the dorsum of the foot with milder changes in the sole.  The changes on the dorsum affect digits 1 through 4 and adjacent foot.  Coordination: Cerebellar testing shows good finger-nose-finger  Gait and station: Station is normal.   Gait is normal.  Tandem gait is mildly wide.  Romberg is negative.  Reflexes: Deep tendon reflexes are symmetric and normal in arms but mildly increased DTR at the left knee.    DIAGNOSTIC DATA (LABS, IMAGING, TESTING) - I reviewed patient records, labs, notes, testing and imaging myself where available.  Lab Results  Component Value Date   WBC 4.7 01/20/2018   HGB 14.0 01/20/2018   HCT 41.0 01/20/2018   MCV 86 01/20/2018   PLT 112 (L) 01/20/2018      Component Value Date/Time   NA 139 07/21/2017 1026   NA 140 05/14/2015 1537   K 4.4 07/21/2017 1026   K 4.1 05/14/2015 1537   CL 104 07/21/2017 1026   CL 107 04/03/2012 0847   CO2 23 07/21/2017 1026   CO2 26 05/14/2015 1537   GLUCOSE 87 07/21/2017 1026   GLUCOSE 114 05/14/2015 1537   GLUCOSE 98 04/03/2012 0847   BUN 9 07/21/2017 1026   BUN 10.7 05/14/2015 1537   CREATININE 0.69 07/21/2017 1026   CREATININE 0.8 05/14/2015 1537   CALCIUM 9.3 07/21/2017 1026   CALCIUM 9.3 05/14/2015 1537   PROT 6.7 01/20/2018 0903   PROT 6.9 05/14/2015 1537   ALBUMIN 4.3 01/20/2018 0903   ALBUMIN 3.8 05/14/2015 1537   AST 24 01/20/2018 0903   AST 14 05/14/2015 1537   ALT 35 (H) 01/20/2018 0903   ALT 15 05/14/2015 1537   ALKPHOS 42 01/20/2018 0903   ALKPHOS 29 (L) 05/14/2015 1537   BILITOT 0.4 01/20/2018 0903   BILITOT 0.39 05/14/2015 1537   GFRNONAA 103 07/21/2017 1026   GFRAA 119 07/21/2017 1026      ASSESSMENT AND PLAN    1. Multiple sclerosis ([place])   2. Malignant neoplasm of right female breast, unspecified estrogen receptor status, unspecified site of breast ([place])   3. Dysesthesia   4. Insomnia, unspecified type    1.   Continue Aubagio.  Her last MRI was stable. 2.  The sensory symptoms in the left foot greater than right foot have persisted.  I believe is more likely to represent mild peroneal neuropathy or an L5 radiculopathy rather than changes from MS.  As she is not getting  any better we will check an NCV/EMG to further characterize and determine if there is a reversible source 3.   Stay active and exercise as tolerated 4/   trazodone as needed insomnia 5.    Return in 6 months or sooner if there are new or worsening neurologic symptoms.  Richard A. Felecia Shelling, MD, PhD 6/50/3546, 5:68 AM Certified in Neurology, Clinical Neurophysiology, Sleep Medicine, Pain Medicine and Neuroimaging  [facility] [address] [place], [place] [address] [phone]

## 2018-10-03 ENCOUNTER — Other Ambulatory Visit: Payer: Self-pay | Admitting: Neurology

## 2018-10-31 ENCOUNTER — Encounter: Payer: BC Managed Care – PPO | Admitting: Neurology

## 2018-11-02 ENCOUNTER — Encounter: Payer: Self-pay | Admitting: Neurology

## 2018-11-02 ENCOUNTER — Other Ambulatory Visit: Payer: Self-pay

## 2018-11-02 ENCOUNTER — Encounter (INDEPENDENT_AMBULATORY_CARE_PROVIDER_SITE_OTHER): Payer: BC Managed Care – PPO | Admitting: Neurology

## 2018-11-02 ENCOUNTER — Ambulatory Visit: Payer: BC Managed Care – PPO | Admitting: Neurology

## 2018-11-02 DIAGNOSIS — Z0289 Encounter for other administrative examinations: Secondary | ICD-10-CM

## 2018-11-02 DIAGNOSIS — R208 Other disturbances of skin sensation: Secondary | ICD-10-CM | POA: Diagnosis not present

## 2018-11-02 NOTE — Progress Notes (Signed)
Full Name: Camree Caba Gender: Female MRN #: [id number] Date of Birth: 18-Sep-1970    Visit Date: 11/02/2018 13:25 Age: 48 Years 53 Months Old Examining Physician: Arlice Colt, MD  Referring Physician: Arlice Colt, MD    History: Ms. Randle is a 48 year old woman with multiple sclerosis with numbness and weakness in the left leg.   She has much milder symptoms on the right.  On exam, strength is 4+/5 in the foot and ankle extensors on the left and normal on the right.  There is reduced sensation in the anterolateral lower leg and dorsum of the foot  Nerve conduction studies: The left peroneal motor response was reduced in amplitude with delayed distal latency and normal conduction velocity.  The right peroneal motor response and both tibial motor responses had normal distal latencies, amplitudes and conduction velocities.  Sural and superficial peroneal sensory responses were present in both legs but mildly reduced on the right compared to the left in amplitude.  Tibial F-wave responses were minimally prolonged.  Electromyography: Needle EMG of selected muscles of both legs was performed.  There was mild chronic denervation noted in the left tibialis anterior and peroneus longus muscles.  Other muscles evaluated were normal.  There was no abnormal spontaneous activity.  Impression: This NCV/EMG study shows the following: 1.   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 2.   There did not appear to be a superimposed radiculopathy.   A. Felecia Shelling, MD, PhD, FAAN Certified in Neurology, Clinical Neurophysiology, Sleep Medicine, Pain Medicine and Neuroimaging Director, [facility] [address] [place], [place] [address] [phone]          [facility]    Nerve / Sites Muscle Latency Ref. Amplitude Ref. Rel Amp Segments Distance Velocity Ref. Area    ms ms mV mV %  cm m/s  m/s mVms  L Peroneal - EDB     Ankle EDB 7.8 ?6.5 1.0 ?2.0 100 Ankle - EDB 9   5.4     Fib head EDB 14.2  1.2  124 Fib head - Ankle 29 45 ?44 6.8     Pop fossa EDB 16.5  1.1  90.5 Pop fossa - Fib head 10 44 ?44 7.1         Pop fossa - Ankle      R Peroneal - EDB     Ankle EDB 4.4 ?6.5 4.3 ?2.0 100 Ankle - EDB 9   17.4     Fib head EDB 11.0  4.5  104 Fib head - Ankle 29 44 ?44 18.0     Pop fossa EDB 13.3  4.3  96.2 Pop fossa - Fib head 10 44 ?44 17.3         Pop fossa - Ankle      L Tibial - AH     Ankle AH 3.8 ?5.8 9.0 ?4.0 100 Ankle - AH 9   25.1     Pop fossa AH 13.4  5.1  57.1 Pop fossa - Ankle 39 41 ?41 24.1  R Tibial - AH     Ankle AH 3.8 ?5.8 6.8 ?4.0 100 Ankle - AH 9   16.3     Pop fossa AH 13.2  3.5  51.7 Pop fossa - Ankle 39 41 ?41 16.1             SNC    Nerve /  Sites Rec. Site Peak Lat Ref.  Amp Ref. Segments Distance    ms ms V V  cm  L Sural - Ankle (Calf)     Calf Ankle 4.2 ?4.4 7 ?6 Calf - Ankle 14  R Sural - Ankle (Calf)     Calf Ankle 3.9 ?4.4 3 ?6 Calf - Ankle 14  L Superficial peroneal - Ankle     Lat leg Ankle 4.4 ?4.4 6 ?6 Lat leg - Ankle 14  R Superficial peroneal - Ankle     Lat leg Ankle 4.3 ?4.4 3 ?6 Lat leg - Ankle 14              F  Wave    Nerve F Lat Ref.   ms ms  L Tibial - AH 58.3 ?56.0  R Tibial - AH 58.6 ?56.0         EMG full       EMG Summary Table    Spontaneous MUAP Recruitment  Muscle IA Fib PSW Fasc Other Amp Dur. Poly Pattern  L. Vastus medialis Normal None None None _______ Normal Normal Normal Normal  L. Tibialis anterior Normal None None None _______ Normal Normal 1+ Reduced  L. Peroneus longus Normal None None None _______ Increased Normal 1+ Reduced  L. Gastrocnemius (Medial head) Normal None None None _______ Normal Normal Normal Normal  L. Extensor digitorum brevis Normal None None None _______ Normal Normal Normal Normal  L. Gluteus medius Normal None None None _______ Normal Normal Normal Normal  R. Vastus medialis  Normal None None None _______ Normal Normal Normal Normal  R. Tibialis anterior Normal None None None _______ Normal Normal Normal Normal  R. Peroneus longus Normal None None None _______ Normal Normal Normal Normal  R. Gastrocnemius (Medial head) Normal None None None _______ Normal Normal Normal Normal  R. Gluteus medius Normal None None None _______ Normal Normal Normal Normal

## 2019-01-01 ENCOUNTER — Other Ambulatory Visit: Payer: Self-pay | Admitting: Obstetrics and Gynecology

## 2019-01-01 ENCOUNTER — Telehealth: Payer: Self-pay | Admitting: Neurology

## 2019-01-01 DIAGNOSIS — Z1231 Encounter for screening mammogram for malignant neoplasm of breast: Secondary | ICD-10-CM

## 2019-01-01 NOTE — Telephone Encounter (Signed)
I reached out to the pt. She reports she is going today for a Covid19 test. She wanted confirm if Dr. Felecia Shelling needed to place an order for it?  I advised the pt an order should not be needed but if so to contact PCP. Pt was agreeable. Pt reports she needs the test in order to return to work.

## 2019-01-01 NOTE — Telephone Encounter (Signed)
Pt called wanting to speak to RN about possible COVID symptoms. Please advise.

## 2019-02-21 ENCOUNTER — Ambulatory Visit
Admission: RE | Admit: 2019-02-21 | Discharge: 2019-02-21 | Disposition: A | Discharge status: Home / Self Care | Payer: BLUE CROSS/BLUE SHIELD | Source: Ambulatory Visit | Attending: Obstetrics and Gynecology | Admitting: Obstetrics and Gynecology

## 2019-02-21 ENCOUNTER — Other Ambulatory Visit: Payer: Self-pay

## 2019-02-21 DIAGNOSIS — Z1231 Encounter for screening mammogram for malignant neoplasm of breast: Secondary | ICD-10-CM

## 2019-02-27 ENCOUNTER — Ambulatory Visit: Payer: BC Managed Care – PPO | Admitting: Neurology

## 2019-03-28 ENCOUNTER — Telehealth (INDEPENDENT_AMBULATORY_CARE_PROVIDER_SITE_OTHER): Payer: 59 | Admitting: Neurology

## 2019-03-28 DIAGNOSIS — R208 Other disturbances of skin sensation: Secondary | ICD-10-CM

## 2019-03-28 DIAGNOSIS — Z853 Personal history of malignant neoplasm of breast: Secondary | ICD-10-CM | POA: Diagnosis not present

## 2019-03-28 DIAGNOSIS — R209 Unspecified disturbances of skin sensation: Secondary | ICD-10-CM | POA: Diagnosis not present

## 2019-03-28 DIAGNOSIS — G35 Multiple sclerosis: Secondary | ICD-10-CM | POA: Diagnosis not present

## 2019-03-28 NOTE — Progress Notes (Signed)
[facility]  PATIENT: Traci Watkins DOB: 08-07-1970  REFERRING DOCTOR OR PCP:  Carlos Levering [facility]) SOURCE: patient and EMR records  _________________________________   HISTORICAL  CHIEF COMPLAINT:  No chief complaint on file.   HISTORY OF PRESENT ILLNESS:  Traci Watkins is a 49 y.o. woman with relapsing remitting multiple sclerosis.     Update 03/28/2019: Virtual Visit via Video Note I connected with Traci Watkins  on 03/28/19 at  3:00 PM EST by a video enabled telemedicine application and verified that I am speaking with the correct person.  I discussed the limitations of evaluation and management by telemedicine and the availability of in person appointments. The patient expressed understanding and agreed to proceed.  History of Present Illness: She continues to note numbness in both feet.  Additionally she reports that the feet are cold to her touch.  She does not have skin changes.  She notes the numbness bilaterally.  It started on the left side.  On 11/02/2018 she had a NCV/EMG study.  It showed mild left peroneal neuropathy at or above the fibular head but there did not appear to be any neurologic issue on the right side and there was no superimposed radiculopathy.  There was no generalized polyneuropathy.  In the long discussion about the possible causes of her symptoms.  She does not appear to have a generalized polyneuropathy.  It is possible that MS could cause this symptom.  A lesion in the posterior columns might cause numbness without weakness in the bilateral.  Observations/Objective:   She is a well-developed well-nourished woman in no acute distress.  The head is normocephalic and atraumatic.  Sclera are anicteric.  Visible skin appears normal.    She is alert and fully oriented with fluent speech and good attention, knowledge and memory.  Extraocular muscles are intact.  Facial strength is normal. She appears to have normal strength in the arms.   Rapid alternating movements and finger-nose-finger are performed well.  Assessment and Plan: Multiple sclerosis ([place])  Dysesthesia  Hx Breast Cancer (DCIS) Right, Stage 0, receptor +  Bilateral cold feet  1.   Check arterial Doppler to rule out vascular etiology as she reports that the feet are also cold to the touch. 2.   Her MS has seemed to be stable but we need to check an MRI of the brain and MRI of the thoracic spine to further evaluate for effectiveness of her medications and to determine if a thoracic spine process could be causing her symptoms. 3.  rtc 6 months  Follow Up Instructions: I discussed the assessment and treatment plan with the patient. The patient was provided an opportunity to ask questions and all were answered. The patient agreed with the plan and demonstrated an understanding of the instructions.    The patient was advised to call back or seek an in-person evaluation if the symptoms worsen or if the condition fails to improve as anticipated.  I provided 30 minutes of non-face-to-face time during this encounter.    Update 09/13/18: She is doing well on Aubagio with no new symptoms.   She is still experiencing numbness in her feet.   Initially, only the left foot was having numbness but now both are.    She sometimes has numbness in her hands that is milder.   Sensory changes are mostly the top of the feet, and first 3-4 toes.   The great toe  is mildly weak.    Symptoms started in December  and have been stable the last few months.  She has no recent trauma but she recalls a fall landing on the one hip while at a restaurant 2 years ago.  Gait is fine.   Balance is slightly off (old).    Bladder function is stable.   She had a detached retina and vision on the left is still affected.     She is sleeping ok most nights and rarely takes trazodone.    She notes some fatigue in the afternoons.    She is working daily but is able to socially distance well at work.  She  has breast cancer (2012) but has not had any recurrence.  She follows up next month. (saw Dr. Marylou Flesher)  Update 06/14/2018: She is on Aubagio for her MS (in 2019 she had an MRI showing a new lesion not present on previous MRI, promptly her to go back on a disease modifying therapy).   She was experiencing numbness in her left foot at the last visit.   The left foot is slightly more than the last visit but the right foot is now involved .  On the left, numbness is from the lower shin down.   On the right, numbness is in the great toe and adjacent foot.  There is tingling but not painful.  No radicular symptoms.  She has mild weakness in the left toes but not in the ankle.   She has no change in gait.  She has a little bit of urinary frequency at times but this is not worse over the past few months.  Vision is fine.  She has less fatigue with the phentermine and is working full-time.  She is able to socially distance at work.  She has had more insomnia.  This appears to be sleep onset more than sleep maintenance.  Mood is doing well.  She has breast cancer and recent mammogram showed no recurrence.  Update 03/10/2018: She is having trouble getting her Aubagio.  She is still [employer] but the policy may have changed. She has tolerated it well and had mild headache the first month but no issue well.   She had a little hair loss but it stopped.   Gait is doing well.   She notes some numbness in her left foot x 2 months.    It seems to involve the entire foot and feels cold.  She notes that temperature sensation is reduced.    No weakness or clumsiness.    No change in bladder (has some frequency x years).     Compared to last month, it is unchanged.   She notes a fall last year.    She denies much fatigue and sleeps well most nights.   She may be starting menopause.    Cognition is fine.   Mood is stable.     Her breast cancer is doing well.   She is off all therapy now.     Her hip pain is doing well.        Update 07/21/2017: She had an MRI of the brain 07/19/2017.  It shows one additional focus in the left frontal juxtacortical white matter that was not present on the 09/13/2014 MRI.  The foci is looked unchanged.  I reviewed the MRI images in front of her and her husband.  We compared the MRI to her 2016 MRI.  She has been off of any disease modifying therapy since 2009.  He stopped the beta for  due to difficulties with the injections.  She had skin necrosis of these months.  Since that time, she has had 3 or 4 brain MRIs and all of the other MRIs showed stable low lesion load in the brain.  The current MRI is the first 1 to show changes since she stopped the medication.  We had a long discussion about the significance of the findings in detail discussed several disease modifying therapy.  Update 07/06/2017: The last MRI of the brain was performed 09/16/2014 and showed no new lesions (only 3-4 old ones).   The cervical spine was normal.    She stopped Betaseron in 2009 and has had no further relapse.       She is doing well and denies any difficulty with gait or balance.    Strength and sensation are fine.    She has not had further episodes of left leg dysesthesia.    Vision is doing well.    Bladder function is fine.   Fatigue is doing well despite sleep maintenance insomnia.    She has never taken anything for the insomnia except melatonin a few times years ago.     Mood is doing well and she has less stress than 6 months ago when her husband was getting RadRx and chemotherapy.     She denies cognitive issues.     She reports pain in her left lower back since a fall on her left side 4 months ago.    Pain does not radiate into leg but it moves some into the hip.   Pain is worse with sitting a long time and with rolling over in bed.    Ice helps some but Ibuprofen has not helped.     She has breast cancer and sees oncology regularly and is in remission x 8-9 and has a mammogram annually.    From  07/02/2016:   MS:     Her MS has been stable for the most part. She had only one exacerbation in the past 10 years. In July 2016 she had a small episode with left leg weakness. The left leg was also involved with her initial exacerbation in 2000. She received several days of IV steroids to 2016. MRI of the brain just showed a couple of MS plaques in the brain MRI of the cervical spine at that time (09/16/2014) did not show any plaques.    Breast Cancer:   She is doing well and her tamoxifen was recently stopped.  She was diagnosed > 5 years ago.    She has a mammography later this year.   She was treated with surgery and RadRx and tamoxifen.   No classic chemotherapy.    Gait/strength/sensation: She is walking without difficulty. Every now and then, when she is more tired the left leg will seem slightly clumsy but this does not happen often.   She has some left leg dysesthesias and it feels different than the right     Bowel/bladder: She denies any difficulty with bladder or bowel function.  Vision: She denies any MS related visual problems. She has never had optic neuritis or diplopia. There is no eye pain.  Fatigue/sleep. She feels her fatigue is doing better on phentermine.   She gets mild dry mouth and occasional some insomnia.    She feels less exhausted.  She is on phentermine 37.5 mg and also lost a few pounds.      Mood/cognition: She denies any depression.   She  has not noted any cognitive issues at work or at home.   She works as a Government social research officer.     Vit D deficiency:  She takes Vit D 5000 U daily.   Her level was 32.4 last year.      FH:  Her cousin was diagnosed with MS at around age 16 last year.  MS history: In 2000 presented with right facial numbness and later that year had left leg numbness. An MRI and lumbar puncture were performed. She was diagnosed with probable multiple sclerosis. One year later and another MRI was performed that showed additional lesions. She then was  diagnosed with clinically definite multiple sclerosis. She was placed on the Betaseron she stopped Betaseron around 2009 due to side effects of the medication. Clinically, she had done well with no exacerbations.   At that time, she had had several stable MRIs during the interim additionally, she has had no further clinical relapses. We made an agreement that she will continue to get occasional MRI monitoring to make sure that there was not subclinical progression of her disease. He rlast MRI was Fortunately, several more MRIs over the last 6 years have shown no further disease activity. Additionally, she has had no clinical relapses and no progression of disability.    REVIEW OF SYSTEMS: Constitutional: No fevers, chills, sweats, or change in appetite Eyes: No visual changes, double vision, eye pain Ear, nose and throat: No hearing loss, ear pain, nasal congestion, sore throat Cardiovascular: No chest pain, palpitations Respiratory: No shortness of breath at rest or with exertion.   No wheezes GastrointestinaI: No nausea, vomiting, diarrhea, abdominal pain, fecal incontinence Genitourinary: No dysuria, urinary retention or frequency.  No nocturia. Musculoskeletal: No neck pain, back pain Integumentary: No rash, pruritus, skin lesions Neurological: as above Psychiatric: No depression at this time.  Some anxiety Endocrine: No palpitations, diaphoresis, change in appetite, change in weigh or increased thirst Hematologic/Lymphatic: No anemia, purpura, petechiae. Allergic/Immunologic: No itchy/runny eyes, nasal congestion, recent allergic reactions, rashes  ALLERGIES: No Known Allergies  HOME MEDICATIONS:  Current Outpatient Medications:  .  AUBAGIO 14 MG TABS, TAKE 1 TABLET DAILY, Disp: 90 tablet, Rfl: 3 .  cholecalciferol (VITAMIN D) 1000 units tablet, Take 5,000 Units by mouth daily., Disp: , Rfl:  .  ibuprofen (ADVIL) 800 MG tablet, Take 800 mg by mouth every 8 (eight) hours as  needed., Disp: , Rfl:  .  Multiple Vitamin (MULTIVITAMIN) tablet, Take 1 tablet by mouth daily., Disp: , Rfl:  .  Multiple Vitamins-Minerals (ZINC PO), Take 1 Dose by mouth daily., Disp: , Rfl:  .  phentermine 37.5 MG capsule, TAKE ONE CAPSULE BY MOUTH EVERY MORNING, Disp: 30 capsule, Rfl: 5 .  traZODone (DESYREL) 50 MG tablet, Take 1 tablet (50 mg total) by mouth at bedtime. (Patient taking differently: Take 50 mg by mouth at bedtime as needed. ), Disp: 30 tablet, Rfl: 5  PAST MEDICAL HISTORY: Past Medical History:  Diagnosis Date  . Anemia    during pregnancy  . Anxiety   . Breast cancer ([place]) 03/24/10   /04/29/10 lumpectomt dcis  . MS (multiple sclerosis) ([place])   . Multiple sclerosis ([place])    10 years,tx with interferon  . Personal history of radiation therapy 2012    PAST SURGICAL HISTORY: Past Surgical History:  Procedure Laterality Date  . BREAST BIOPSY Right 04/03/2010  . BREAST LUMPECTOMY Right 04/29/10  . LAPAROSCOPIC CHOLECYSTECTOMY  07/26/08    FAMILY HISTORY: Family History  Problem Relation  Age of Onset  . Cancer Father        bladder  . Aneurysm Father   . Breast cancer Maternal Uncle   . Breast cancer Maternal Aunt     SOCIAL HISTORY:  Social History   Socioeconomic History  . Marital status: Married    Spouse name: Not on file  . Number of children: 1  . Years of education: Not on file  . Highest education level: Not on file  Occupational History  . Occupation: Health visitor: [employer]  Tobacco Use  . Smoking status: Former Smoker    Packs/day: 0.50    Types: Cigarettes  . Smokeless tobacco: Never Used  Substance and Sexual Activity  . Alcohol use: Yes    Comment: socially  . Drug use: No    Comment: 10 years,quit 2008  . Sexual activity: Not on file  Other Topics Concern  . Not on file  Social History Narrative  . Not on file   Social Determinants of Health   Financial Resource Strain:   . Difficulty of Paying Living  Expenses: Not on file  Food Insecurity:   . Worried About Charity fundraiser in the Last Year: Not on file  . Ran Out of Food in the Last Year: Not on file  Transportation Needs:   . Lack of Transportation (Medical): Not on file  . Lack of Transportation (Non-Medical): Not on file  Physical Activity:   . Days of Exercise per Week: Not on file  . Minutes of Exercise per Session: Not on file  Stress:   . Feeling of Stress : Not on file  Social Connections:   . Frequency of Communication with Friends and Family: Not on file  . Frequency of Social Gatherings with Friends and Family: Not on file  . Attends Religious Services: Not on file  . Active Member of Clubs or Organizations: Not on file  . Attends Archivist Meetings: Not on file  . Marital Status: Not on file  Intimate Partner Violence:   . Fear of Current or Ex-Partner: Not on file  . Emotionally Abused: Not on file  . Physically Abused: Not on file  . Sexually Abused: Not on file     PHYSICAL EXAM  There were no vitals filed for this visit.  There is no height or weight on file to calculate BMI.   General: The patient is well-developed and well-nourished and in no acute distress.  Only mild lower back tenderness  Neurologic Exam  Mental status: The patient is alert and oriented x 3 at the time of the examination. The patient has apparent normal recent and remote memory, with an apparently normal attention span and concentration ability.   Speech is normal.  Cranial nerves: Extraocular movements are full.  Facial strength is normal.  Trapezius strength is normal.  The tongue is midline, and the patient has symmetric elevation of the soft palate. No obvious hearing deficits are noted.  Motor:  Muscle bulk is normal.   Tone is normal. Strength is 5/5 in all extremities except for 4+/5 in the extensor hallucis longus muscle on the left.  Functional testing shows slight left ankle dipping when standing on the  heels.  Sensory: She has normal sensation in the arms.. There is slightly reduced sensation in the lower leg on the left relative to the right to touch and temperature.  Superimposed on this is worse sensation in the dorsum of the foot  with milder changes in the sole.  The changes on the dorsum affect digits 1 through 4 and adjacent foot.  Coordination: Cerebellar testing shows good finger-nose-finger  Gait and station: Station is normal.   Gait is normal.  Tandem gait is mildly wide.  Romberg is negative.  Reflexes: Deep tendon reflexes are symmetric and normal in arms but mildly increased DTR at the left knee.    DIAGNOSTIC DATA (LABS, IMAGING, TESTING) - I reviewed patient records, labs, notes, testing and imaging myself where available.  Lab Results  Component Value Date   WBC 4.7 01/20/2018   HGB 14.0 01/20/2018   HCT 41.0 01/20/2018   MCV 86 01/20/2018   PLT 112 (L) 01/20/2018      Component Value Date/Time   NA 139 07/21/2017 1026   NA 140 05/14/2015 1537   K 4.4 07/21/2017 1026   K 4.1 05/14/2015 1537   CL 104 07/21/2017 1026   CL 107 04/03/2012 0847   CO2 23 07/21/2017 1026   CO2 26 05/14/2015 1537   GLUCOSE 87 07/21/2017 1026   GLUCOSE 114 05/14/2015 1537   GLUCOSE 98 04/03/2012 0847   BUN 9 07/21/2017 1026   BUN 10.7 05/14/2015 1537   CREATININE 0.69 07/21/2017 1026   CREATININE 0.8 05/14/2015 1537   CALCIUM 9.3 07/21/2017 1026   CALCIUM 9.3 05/14/2015 1537   PROT 6.7 01/20/2018 0903   PROT 6.9 05/14/2015 1537   ALBUMIN 4.3 01/20/2018 0903   ALBUMIN 3.8 05/14/2015 1537   AST 24 01/20/2018 0903   AST 14 05/14/2015 1537   ALT 35 (H) 01/20/2018 0903   ALT 15 05/14/2015 1537   ALKPHOS 42 01/20/2018 0903   ALKPHOS 29 (L) 05/14/2015 1537   BILITOT 0.4 01/20/2018 0903   BILITOT 0.39 05/14/2015 1537   GFRNONAA 103 07/21/2017 1026   GFRAA 119 07/21/2017 1026       Persais Ethridge A. Felecia Shelling, MD, PhD, FAAN Certified in Neurology, Clinical Neurophysiology, Sleep  Medicine, Pain Medicine and Neuroimaging Director, [place] [facility] [address] [place], [place] [address] [phone]

## 2019-04-11 ENCOUNTER — Telehealth: Payer: Self-pay | Admitting: Neurology

## 2019-04-11 DIAGNOSIS — G35 Multiple sclerosis: Secondary | ICD-10-CM

## 2019-04-11 NOTE — Telephone Encounter (Signed)
Lets do the MRI without contrast

## 2019-04-11 NOTE — Telephone Encounter (Signed)
LVM for pt to call back about scheduling [facility] Auth: [id number] & [id number] (exp. 04/11/19 to 05/26/19)

## 2019-04-11 NOTE — Telephone Encounter (Signed)
Pt returned call. Please call back when available. 

## 2019-04-11 NOTE — Telephone Encounter (Signed)
I spoke to the patient to schedule her MRI but she informed me she has had a allergic reaction before. The last time she had the MRI's she stated when they did the contrast her arm felt like it was on fire. Please advise do you want to switch the orders to without contrast?

## 2019-04-12 NOTE — Telephone Encounter (Signed)
no to the covid questions MR Brain wo contrast & MR Thoracic spine wo contrast Dr. Felecia Shelling [facility] Auth: [id number] & [id number] (exp. 04/11/19 to 05/26/19).  Spoke to the patient she is scheduled at [facility] for 04/24/19.

## 2019-04-18 ENCOUNTER — Telehealth: Payer: Self-pay | Admitting: Neurology

## 2019-04-18 DIAGNOSIS — G35 Multiple sclerosis: Secondary | ICD-10-CM

## 2019-04-18 NOTE — Telephone Encounter (Signed)
Submitted urgent PA on CMM. KeyRoyden Purl - PA Case [id number]. Waiting on determination from [facility].

## 2019-04-18 NOTE — Telephone Encounter (Signed)
Phone rep checked office voicemails, pt left message @12 :26 returning call to Terrence Dupont, [place] please call

## 2019-04-18 NOTE — Telephone Encounter (Signed)
I called and spoke with pt. She confirmed her insurance changed to [facility]. ID: [id number]. Group: [id number]. RxBIN: [id number]. RxPCN: [id number], RxGrp: [id number]. Phone # [phone]. She fills with [facility]. Advised I will submit urgent PA for her. She only has 4 tablets left.

## 2019-04-18 NOTE — Telephone Encounter (Addendum)
Request Reference Number: [id number]. AUBAGIO TAB 14MG  is approved through 04/17/2020. Your patient may now fill this prescription and it will be covered.  I called pt to let her know. She is asking if she still fills with [facility]. I advised her insurance usually will have a preferred specialty pharmacy. She is going to contact [facility] and see if she still can use [facility] or if she needs to change pharmacies. She will call back if it is a different pharmacy so I can send in a new prescription.

## 2019-04-18 NOTE — Telephone Encounter (Addendum)
Called, LVM returning pt call. The last insurance we have on file is for Traci Watkins. Did she change insurance to [facility]? If so, we will need new insurance info (ID, Rxgroup, pcn and BIN, phone#). We can try and do prior auth via [facility] to get Aubagio approved

## 2019-04-18 NOTE — Telephone Encounter (Signed)
Pt called stating that [facility] has denied her AUBAGIO 14 MG TABS and she has been informed that she is to take another form of the Aubagio for it to be covered. Pt states she is about to run out of medication. Please advise.

## 2019-04-19 MED ORDER — AUBAGIO 14 MG PO TABS: 1.0000 | ORAL_TABLET | Freq: Every day | ORAL | 3 refills | Status: DC

## 2019-04-19 NOTE — Telephone Encounter (Signed)
Informed pt rx has been sent.

## 2019-04-19 NOTE — Addendum Note (Signed)
Addended by: Wyvonnia Lora on: 04/19/2019 03:11 PM   Modules accepted: Orders

## 2019-04-19 NOTE — Telephone Encounter (Signed)
[facility] as requested.

## 2019-04-19 NOTE — Telephone Encounter (Addendum)
Patient called to inform the pharmacy prescription needs to be sent to [facility] [phone]  Requested cb once processed

## 2019-04-20 ENCOUNTER — Telehealth: Payer: Self-pay | Admitting: Neurology

## 2019-04-20 NOTE — Telephone Encounter (Signed)
Received page that she was going to have a very high co-pay for Aubagio.  I called and left a VM message.   Received second page that she had worked issue out.

## 2019-04-24 ENCOUNTER — Other Ambulatory Visit: Payer: Self-pay

## 2019-04-24 ENCOUNTER — Ambulatory Visit: Payer: 59

## 2019-04-24 DIAGNOSIS — G35 Multiple sclerosis: Secondary | ICD-10-CM | POA: Diagnosis not present

## 2019-04-26 ENCOUNTER — Telehealth: Payer: Self-pay | Admitting: Neurology

## 2019-04-26 DIAGNOSIS — R209 Unspecified disturbances of skin sensation: Secondary | ICD-10-CM

## 2019-04-26 DIAGNOSIS — R208 Other disturbances of skin sensation: Secondary | ICD-10-CM

## 2019-04-26 NOTE — Telephone Encounter (Signed)
Pt called to check on the status of her MRI results. Informed pt of up to two-week FU time and that RN will FU once report is received from MD. Pt then requested a CB from the RN

## 2019-04-26 NOTE — Telephone Encounter (Signed)
Hinton Dyer- can you check into this and make sure she gets scheduled for the aterial doppler that was ordered in February?  Called pt back. Relayed Dr. Garth Bigness message. She verbalized understanding. She never heard about getting scheduled for arterial Doppler that was ordered at her last visit. Advised I will have referral coordinator f/u on this and make sure she gets scheduled.

## 2019-04-26 NOTE — Telephone Encounter (Signed)
Please let her know that the MRI of the brain looked good.  There were no new lesions.  The MRI of the thoracic spine did not show any any MS lesions.  She did have some mild degenerative changes at one level but there is no nerve root compression.

## 2019-04-26 NOTE — Telephone Encounter (Signed)
Dr. Felecia Shelling- do you have MRI results?

## 2019-05-02 ENCOUNTER — Other Ambulatory Visit: Payer: Self-pay | Admitting: Neurology

## 2019-05-03 NOTE — Telephone Encounter (Signed)
Placed orders as requested

## 2019-05-03 NOTE — Telephone Encounter (Signed)
Patient is scheduled at [facility] 05/10/2019 check in admitting I have called patient and asked her to call me back so I can tell her details.

## 2019-05-03 NOTE — Telephone Encounter (Signed)
Patient called me back I talked to her .

## 2019-05-03 NOTE — Telephone Encounter (Signed)
Traci Watkins you are going to have to re- order please per hospital requirements I can get patient scheduled for next week cone does two different test.  1st  Order is [id number] and second is [id number] per [employer] and choose location [facility].   Thanks Traci Watkins

## 2019-05-03 NOTE — Addendum Note (Signed)
Addended by: Wyvonnia Lora on: 05/03/2019 01:55 PM   Modules accepted: Orders

## 2019-05-08 ENCOUNTER — Telehealth: Payer: Self-pay | Admitting: *Deleted

## 2019-05-08 ENCOUNTER — Ambulatory Visit (HOSPITAL_COMMUNITY)
Admission: RE | Admit: 2019-05-08 | Discharge: 2019-05-08 | Disposition: A | Discharge status: Home / Self Care | Payer: 59 | Source: Ambulatory Visit | Attending: Neurology | Admitting: Neurology

## 2019-05-08 ENCOUNTER — Other Ambulatory Visit: Payer: Self-pay

## 2019-05-08 ENCOUNTER — Telehealth: Payer: Self-pay | Admitting: Neurology

## 2019-05-08 DIAGNOSIS — R208 Other disturbances of skin sensation: Secondary | ICD-10-CM

## 2019-05-08 DIAGNOSIS — R209 Unspecified disturbances of skin sensation: Secondary | ICD-10-CM | POA: Diagnosis present

## 2019-05-08 NOTE — Telephone Encounter (Signed)
-----   Message from Britt Bottom, MD sent at 05/08/2019  9:44 AM EDT ----- Please let her know that the vascular study in the legs was normal

## 2019-05-08 NOTE — Progress Notes (Signed)
ABI completed. Refer to "CV Proc" under chart review to view preliminary results.  05/08/2019 9:03 AM Kelby Aline., MHA, RVT, RDCS, RDMS

## 2019-05-08 NOTE — Telephone Encounter (Signed)
lVM for pt to call back and schedule a 6 month f/u from her vv in February. If pt calls back please schedule on or around 09/25/2019

## 2019-05-08 NOTE — Telephone Encounter (Signed)
Called, LVM for pt about results per Dr. Felecia Shelling note (ok per [facility]). Advised her no return call necessary unless she had further questions.

## 2019-05-10 ENCOUNTER — Encounter (HOSPITAL_COMMUNITY): Payer: BC Managed Care – PPO

## 2019-09-28 ENCOUNTER — Encounter: Payer: Self-pay | Admitting: Genetic Counselor

## 2019-11-26 ENCOUNTER — Other Ambulatory Visit: Payer: Self-pay | Admitting: Neurology

## 2019-12-03 ENCOUNTER — Other Ambulatory Visit: Payer: Self-pay | Admitting: Neurology

## 2019-12-03 MED ORDER — PHENTERMINE HCL 37.5 MG PO CAPS: 37.5000 mg | ORAL_CAPSULE | Freq: Every morning | ORAL | 4 refills | Status: DC

## 2019-12-03 NOTE — Telephone Encounter (Signed)
Pt called stating she is needing a refill on her phentermine 37.5 MG capsule sent in to the [facility] on [address].  Abbott Pao states she is completely out and is needing it by Wednesday since she is leaving out of town Thursday morning at 5am. She has scheduled an appt but its not until Jan. Pt stated that she does not want to see the NP

## 2019-12-27 ENCOUNTER — Telehealth: Payer: Self-pay | Admitting: Neurology

## 2019-12-27 NOTE — Telephone Encounter (Signed)
Late entry: I spoke with patient on 12/15/2019, she called through the after hours call service, [place] was at the airport, she was going to take an Manufacturing systems engineer.  She called regarding a supplement called Cold-Eeze.  She reported that she was supposed to call anytime she takes an over-the-counter medication.  I asked her to read the ingredients off of the box, she listed several ingredients including zinc and sambucus nigra.  I advised her that the ingredients think is probably okay for her to take.  I also advised her that I was not familiar with the other listed ingredients.  It would probably be best to check with Dr. Felecia Shelling himself as far safety of these over-the-counter supplements.  She was encouraged to take either Advil or Tylenol if she had a sore throat.  She demonstrated understanding and agreement and reported that she would not take the [place]. I reviewed her chart.  Patient was last seen in a virtual visit in February 2021.  She is supposed to have a follow-up appointment.

## 2019-12-27 NOTE — Telephone Encounter (Signed)
Reviewed pt chart. She has a follow up 03/06/20.

## 2019-12-28 NOTE — Telephone Encounter (Signed)
Ok to take this

## 2020-01-16 ENCOUNTER — Other Ambulatory Visit: Payer: Self-pay | Admitting: Family Medicine

## 2020-01-16 DIAGNOSIS — Z1231 Encounter for screening mammogram for malignant neoplasm of breast: Secondary | ICD-10-CM

## 2020-02-22 ENCOUNTER — Other Ambulatory Visit: Payer: Self-pay | Admitting: Neurology

## 2020-02-22 DIAGNOSIS — G35 Multiple sclerosis: Secondary | ICD-10-CM

## 2020-02-29 ENCOUNTER — Ambulatory Visit
Admission: RE | Admit: 2020-02-29 | Discharge: 2020-02-29 | Disposition: A | Discharge status: Home / Self Care | Payer: 59 | Source: Ambulatory Visit | Attending: Family Medicine | Admitting: Family Medicine

## 2020-02-29 ENCOUNTER — Other Ambulatory Visit: Payer: Self-pay

## 2020-02-29 DIAGNOSIS — Z1231 Encounter for screening mammogram for malignant neoplasm of breast: Secondary | ICD-10-CM

## 2020-03-06 ENCOUNTER — Other Ambulatory Visit: Payer: Self-pay

## 2020-03-06 ENCOUNTER — Ambulatory Visit: Payer: 59 | Admitting: Neurology

## 2020-03-06 ENCOUNTER — Encounter: Payer: Self-pay | Admitting: Neurology

## 2020-03-06 VITALS — BP 150/90 | HR 103 | Ht 68.0 in | Wt 200.5 lb

## 2020-03-06 DIAGNOSIS — C50911 Malignant neoplasm of unspecified site of right female breast: Secondary | ICD-10-CM

## 2020-03-06 DIAGNOSIS — G35 Multiple sclerosis: Secondary | ICD-10-CM | POA: Diagnosis not present

## 2020-03-06 DIAGNOSIS — R208 Other disturbances of skin sensation: Secondary | ICD-10-CM

## 2020-03-06 DIAGNOSIS — Z79899 Other long term (current) drug therapy: Secondary | ICD-10-CM | POA: Diagnosis not present

## 2020-03-06 NOTE — [place] Notes (Signed)
[facility]  PATIENT: Traci Watkins DOB: 1970-12-05  REFERRING DOCTOR OR PCP:  Carlos Levering [facility]) SOURCE: patient and EMR records  _________________________________   HISTORICAL  CHIEF COMPLAINT:  Chief Complaint  Patient presents with  . Follow-up    RM 12, alone. Last seen 03/28/19. On Aubagio for MS.    HISTORY OF PRESENT ILLNESS:  Traci Watkins is a 49 y.o. woman with relapsing remitting multiple sclerosis.     Update 03/28/2019: She is on Aubagio and tolerate it well.   She has no exacerbation recently.      She has numbness in both feet, slightly mor eon her left.  Numbness is more noticeable at night when inactive.    She does not have skin changes but they feels cold.   An arterial study was fine.   On 11/02/2018 she had a NCV/EMG study.  It showed mild left peroneal neuropathy at or above the fibular head but there did not appear to be any neurologic issue on the right side and there was no superimposed radiculopathy.  There was no generalized polyneuropathy.  Gait is fine.  She denies any falls or significant stumbling though sometimes she has felt clumsy.  No weakness in proximal legs but the left toe extension is weak.    Bladder function is fine - occasional urgency bit no incontinence.    Vision is stable.   She had a left detached retina requiring an injection for a gas bubble and scleral buckling.     She has had mild depression since her father died last year.   She has some fatigue.   She is sleeping well most nights.   Cognition is fine.    She has breast cancer (2012) but has not had any recurrence.  She follows up next month. (saw Dr. Marylou Flesher)  MS history:  In 2000 presented with right facial numbness and later that year had left leg numbness. An MRI and lumbar puncture were performed. She was diagnosed with probable multiple sclerosis. One year later and another MRI was performed that showed additional lesions. She then was  diagnosed with clinically definite multiple sclerosis. She was placed on the Betaseron she stopped Betaseron around 2009 due to side effects of the medication. Clinically, she had done well with no exacerbations.   At that time, she had had several stable MRIs during the interim additionally, she has had no further clinical relapses. We made an agreement that she will continue to get occasional MRI monitoring to make sure that there was not subclinical progression of her disease. He rlast MRI was Fortunately, several more MRIs over the last 6 years have shown no further disease activity. Additionally, she has had no clinical relapses and no progression of disability.  IMAGING: MRI brain 04/24/2019 shows several small T2/FLAIR hyperintense foci in the juxtacortical and periventricular white matter of the hemispheres.  None of the foci appears to be acute and they were all present on the previous MRI from 2019.  MRI thoracic spine 04/24/2019 shows a normal spinal cord and mild DDD   MRI cervical spine 09/16/2014 shows a normal spinal cord.  FH:  Her cousin was diagnosed with MS at around age 70.   REVIEW OF SYSTEMS: Constitutional: No fevers, chills, sweats, or change in appetite Eyes: No visual changes, double vision, eye pain Ear, nose and throat: No hearing loss, ear pain, nasal congestion, sore throat Cardiovascular: No chest pain, palpitations Respiratory: No shortness of breath at rest or with  exertion.   No wheezes GastrointestinaI: No nausea, vomiting, diarrhea, abdominal pain, fecal incontinence Genitourinary: No dysuria, urinary retention or frequency.  No nocturia. Musculoskeletal: No neck pain, back pain Integumentary: No rash, pruritus, skin lesions Neurological: as above Psychiatric: No depression at this time.  Some anxiety Endocrine: No palpitations, diaphoresis, change in appetite, change in weigh or increased thirst Hematologic/Lymphatic: No anemia, purpura,  petechiae. Allergic/Immunologic: No itchy/runny eyes, nasal congestion, recent allergic reactions, rashes  ALLERGIES: No Known Allergies  HOME MEDICATIONS:  Current Outpatient Medications:  .  AUBAGIO 14 MG TABS, TAKE 1 TABLET BY MOUTH  DAILY, Disp: 30 tablet, Rfl: 0 .  cholecalciferol (VITAMIN D) 1000 units tablet, Take 5,000 Units by mouth daily., Disp: , Rfl:  .  Omega-3 Fatty Acids (OMEGA 3 PO), Take 1 Dose by mouth daily., Disp: , Rfl:  .  phentermine 37.5 MG capsule, Take 1 capsule (37.5 mg total) by mouth every morning., Disp: 30 capsule, Rfl: 4  PAST MEDICAL HISTORY: Past Medical History:  Diagnosis Date  . Anemia    during pregnancy  . Anxiety   . Breast cancer ([place]) 03/24/10   /04/29/10 lumpectomt dcis  . MS (multiple sclerosis) ([place])   . Multiple sclerosis ([place])    10 years,tx with interferon  . Personal history of radiation therapy 2012    PAST SURGICAL HISTORY: Past Surgical History:  Procedure Laterality Date  . BREAST BIOPSY Right 04/03/2010  . BREAST LUMPECTOMY Right 04/29/10  . LAPAROSCOPIC CHOLECYSTECTOMY  07/26/08    FAMILY HISTORY: Family History  Problem Relation Age of Onset  . Cancer Father        bladder  . Aneurysm Father   . Breast cancer Maternal Uncle   . Breast cancer Maternal Aunt     SOCIAL HISTORY:  Social History   Socioeconomic History  . Marital status: Married    Spouse name: Not on file  . Number of children: 1  . Years of education: Not on file  . Highest education level: Not on file  Occupational History  . Occupation: Health visitor: [employer]  Tobacco Use  . Smoking status: Former Smoker    Packs/day: 0.50    Types: Cigarettes  . Smokeless tobacco: Never Used  Substance and Sexual Activity  . Alcohol use: Yes    Comment: socially  . Drug use: No    Comment: 10 years,quit 2008  . Sexual activity: Not on file  Other Topics Concern  . Not on file  Social History Narrative  . Not on file    Social Determinants of Health   Financial Resource Strain: Not on file  Food Insecurity: Not on file  Transportation Needs: Not on file  Physical Activity: Not on file  Stress: Not on file  Social Connections: Not on file  Intimate Partner Violence: Not on file     PHYSICAL EXAM  Vitals:   03/06/20 1502  BP: (!) 150/90  Pulse: (!) 103  SpO2: 97%  Weight: 200 lb 8 oz (90.9 kg)  Height: 5\' 8"  (1.727 m)    Body mass index is 30.49 kg/m.   General: The patient is well-developed and well-nourished and in no acute distress.  Only mild lower back tenderness  Neurologic Exam  Mental status: The patient is alert and oriented x 3 at the time of the examination. The patient has apparent normal recent and remote memory, with an apparently normal attention span and concentration ability.   Speech is normal.  Cranial  nerves: Extraocular movements are full.  Facial strength is normal.  Trapezius strength is normal.  The tongue is midline, and the patient has symmetric elevation of the soft palate. No obvious hearing deficits are noted.  Motor:  Muscle bulk is normal.   Tone is normal. Strength is 5/5 in all extremities except for 4+/5 in the extensor hallucis longus muscle on the left.    Sensory: She has normal sensation in the arms.  Mildly reduced sensation in the lower left leg compared to the right.  Coordination: Cerebellar testing shows good finger-nose-finger  Gait and station: Station is normal.   Gait is normal.  Tandem gait is mildly wide.  Romberg is negative.  Reflexes: Deep tendon reflexes are symmetric and normal in arms but mildly increased DTR at the left knee.    Multiple sclerosis ([place]) - Plan: CBC with Differential/Platelet, Comprehensive metabolic panel  Dysesthesia - Plan: Comprehensive metabolic panel, Hemoglobin A1c  High risk medication use - Plan: CBC with Differential/Platelet, Comprehensive metabolic panel, Hemoglobin A1c  Malignant neoplasm of  right female breast, unspecified estrogen receptor status, unspecified site of breast ([place])   1.   Continue Aubagio.   Check labwork 2.   Stay active and exercise as tolerated. 3.   Return in 6 months or sooner if there are new or worsening neurologic symptoms.    Zubair Lofton A. Felecia Shelling, MD, PhD, FAAN Certified in Neurology, Clinical Neurophysiology, Sleep Medicine, Pain Medicine and Neuroimaging Director, [place] at [facility] [address], [place], [place] [address] [phone]

## 2020-03-07 LAB — CBC WITH DIFFERENTIAL/PLATELET
Basophils Absolute: 0.1 x10E3/uL (ref 0.0–0.2)
Basos: 1 %
EOS (ABSOLUTE): 0.1 x10E3/uL (ref 0.0–0.4)
Eos: 1 %
Hematocrit: 41.2 % (ref 34.0–46.6)
Hemoglobin: 14.3 g/dL (ref 11.1–15.9)
Immature Grans (Abs): 0 x10E3/uL (ref 0.0–0.1)
Immature Granulocytes: 0 %
Lymphocytes Absolute: 1.9 x10E3/uL (ref 0.7–3.1)
Lymphs: 30 %
MCH: 29.1 pg (ref 26.6–33.0)
MCHC: 34.7 g/dL (ref 31.5–35.7)
MCV: 84 fL (ref 79–97)
Monocytes Absolute: 0.7 x10E3/uL (ref 0.1–0.9)
Monocytes: 11 %
Neutrophils Absolute: 3.5 x10E3/uL (ref 1.4–7.0)
Neutrophils: 57 %
RBC: 4.91 x10E6/uL (ref 3.77–5.28)
RDW: 12.5 % (ref 11.7–15.4)
WBC: 6.2 x10E3/uL (ref 3.4–10.8)

## 2020-03-07 LAB — COMPREHENSIVE METABOLIC PANEL WITH GFR
ALT: 39 IU/L — ABNORMAL HIGH (ref 0–32)
AST: 27 IU/L (ref 0–40)
Albumin/Globulin Ratio: 1.6 (ref 1.2–2.2)
Albumin: 4.3 g/dL (ref 3.8–4.9)
Alkaline Phosphatase: 50 IU/L (ref 44–121)
BUN/Creatinine Ratio: 15 (ref 9–23)
BUN: 13 mg/dL (ref 6–24)
Bilirubin Total: 0.4 mg/dL (ref 0.0–1.2)
CO2: 26 mmol/L (ref 20–29)
Calcium: 9.7 mg/dL (ref 8.7–10.2)
Chloride: 102 mmol/L (ref 96–106)
Creatinine, Ser: 0.87 mg/dL (ref 0.57–1.00)
GFR calc Af Amer: 89 mL/min/1.73
GFR calc non Af Amer: 77 mL/min/1.73
Globulin, Total: 2.7 g/dL (ref 1.5–4.5)
Glucose: 120 mg/dL — ABNORMAL HIGH (ref 65–99)
Potassium: 4.4 mmol/L (ref 3.5–5.2)
Sodium: 139 mmol/L (ref 134–144)
Total Protein: 7 g/dL (ref 6.0–8.5)

## 2020-03-07 LAB — HEMOGLOBIN A1C
Est. average glucose Bld gHb Est-mCnc: 114 mg/dL
Hgb A1c MFr Bld: 5.6 % (ref 4.8–5.6)

## 2020-03-19 ENCOUNTER — Other Ambulatory Visit: Payer: Self-pay | Admitting: Neurology

## 2020-03-19 DIAGNOSIS — G35 Multiple sclerosis: Secondary | ICD-10-CM

## 2020-04-09 ENCOUNTER — Telehealth: Payer: Self-pay | Admitting: *Deleted

## 2020-04-09 NOTE — Telephone Encounter (Signed)
Received fax from [facility] that PA is needed for Aubagio. Submitted PA on CMM. Key: [id number]. Marked urgent. Waiting on determination from [facility].

## 2020-04-09 NOTE — Telephone Encounter (Addendum)
Request Reference Number: [id number]. AUBAGIO TAB 14MG  is approved through 04/09/2021. Your patient may now fill this prescription and it will be covered. Faxed notice of approval to [facility] one at [phone]. Received fax confirmation.

## 2020-05-20 ENCOUNTER — Other Ambulatory Visit: Payer: Self-pay | Admitting: Neurology

## 2020-05-20 NOTE — Telephone Encounter (Signed)
Checked drug registry. She last refilled 04/14/20 #30. Last seen 03/06/20 and next follow up 09/04/20

## 2020-09-04 ENCOUNTER — Encounter: Payer: Self-pay | Admitting: Neurology

## 2020-09-04 ENCOUNTER — Ambulatory Visit: Payer: 59 | Admitting: Neurology

## 2020-09-04 ENCOUNTER — Other Ambulatory Visit: Payer: Self-pay

## 2020-09-04 VITALS — BP 135/97 | HR 101 | Ht 69.0 in | Wt 195.0 lb

## 2020-09-04 DIAGNOSIS — R208 Other disturbances of skin sensation: Secondary | ICD-10-CM

## 2020-09-04 DIAGNOSIS — Z853 Personal history of malignant neoplasm of breast: Secondary | ICD-10-CM | POA: Diagnosis not present

## 2020-09-04 DIAGNOSIS — Z79899 Other long term (current) drug therapy: Secondary | ICD-10-CM | POA: Diagnosis not present

## 2020-09-04 DIAGNOSIS — G5602 Carpal tunnel syndrome, left upper limb: Secondary | ICD-10-CM | POA: Insufficient documentation

## 2020-09-04 DIAGNOSIS — F418 Other specified anxiety disorders: Secondary | ICD-10-CM

## 2020-09-04 DIAGNOSIS — G35 Multiple sclerosis: Secondary | ICD-10-CM

## 2020-09-04 DIAGNOSIS — G5732 Lesion of lateral popliteal nerve, left lower limb: Secondary | ICD-10-CM

## 2020-09-04 NOTE — [place] Notes (Signed)
[facility]  PATIENT: Traci Watkins DOB: 1970-10-17  REFERRING DOCTOR OR PCP:  Carlos Levering [facility]) SOURCE: patient and EMR records  _________________________________   HISTORICAL  CHIEF COMPLAINT:  Chief Complaint  Patient presents with   Follow-up    Pt alone, rm 2. Pt having a few things to discuss. Taking Aubagio    HISTORY OF PRESENT ILLNESS:  Traci Watkins is a 50 y.o. woman with relapsing remitting multiple sclerosis.     Update 09/04/2020: She is on Aubagio and tolerate it well.   She has no exacerbation recently.    She had stopped Betaseron in 2009 and had several stable MRIs.  However, MRI in 2019 showed one new lesion not present in 2016 and she started the Aubagio then.   The 2021 MRi showed no new lesions.    Gait is doing well.    She has numbness in both feet, left slightly more than right.   She also has some hand numbness, left > right.  Feet are sometimes cold but arterial studies were fine.   .No weakness in proximal legs but the left toe extension is weak.    Bladder function is fine - occasional urgency butno incontinence.    Vision is stable.   She had a detached retina and needed a buckling procedure  On 11/02/2018 she had a NCV/EMG study.  It showed mild left peroneal neuropathy at or above the fibular head but there did not appear to be any neurologic issue on the right side and there was no superimposed radiculopathy.  There was no generalized polyneuropathy.  She has some fatigue.   She is sleeping well most nights.   Cognition is fine.     She has had mild depression since her father died last year.   She also has more anxiety.   She became tearful this visit.   She worries about  She has breast cancer (2012) but has not had any recurrence.    MS history:  In 2000 presented with right facial numbness and later that year had left leg numbness. An MRI and lumbar puncture were performed. She was diagnosed with probable multiple  sclerosis. One year later and another MRI was performed that showed additional lesions. She then was diagnosed with clinically definite multiple sclerosis. She was placed on the Betaseron she stopped Betaseron around 2009 due to side effects of the medication. Clinically, she had done well with no exacerbations.   At that time, she had had several stable MRIs during the interim additionally, she has had no further clinical relapses. We made an agreement that she will continue to get occasional MRI monitoring to make sure that there was not subclinical progression of her disease. He rlast MRI was Fortunately, several more MRIs over the last 6 years have shown no further disease activity. Additionally, she has had no clinical relapses and no progression of disability.  IMAGING: MRI brain 04/24/2019 shows several small T2/FLAIR hyperintense foci in the juxtacortical and periventricular white matter of the hemispheres.  None of the foci appears to be acute and they were all present on the previous MRI from 2019.  MRI thoracic spine 04/24/2019 shows a normal spinal cord and mild DDD   MRI cervical spine 09/16/2014 shows a normal spinal cord.  FH:  Her cousin was diagnosed with MS at around age 79.   REVIEW OF SYSTEMS: Constitutional: No fevers, chills, sweats, or change in appetite Eyes: No visual changes, double vision, eye pain Ear,  nose and throat: No hearing loss, ear pain, nasal congestion, sore throat Cardiovascular: No chest pain, palpitations Respiratory:  No shortness of breath at rest or with exertion.   No wheezes GastrointestinaI: No nausea, vomiting, diarrhea, abdominal pain, fecal incontinence Genitourinary:  No dysuria, urinary retention or frequency.  No nocturia. Musculoskeletal:  No neck pain, back pain Integumentary: No rash, pruritus, skin lesions Neurological: as above Psychiatric: No depression at this time.  Some anxiety Endocrine: No palpitations, diaphoresis, change in appetite,  change in weigh or increased thirst Hematologic/Lymphatic:  No anemia, purpura, petechiae. Allergic/Immunologic: No itchy/runny eyes, nasal congestion, recent allergic reactions, rashes  ALLERGIES: No Known Allergies  HOME MEDICATIONS:  Current Outpatient Medications:    AUBAGIO 14 MG TABS, TAKE 1 TABLET BY MOUTH  DAILY, Disp: 30 tablet, Rfl: 11   cholecalciferol (VITAMIN D) 1000 units tablet, Take 5,000 Units by mouth daily., Disp: , Rfl:    Cyanocobalamin (VITAMIN B-12 PO), Take 1 tablet by mouth daily., Disp: , Rfl:    Omega-3 Fatty Acids (OMEGA 3 PO), Take 1 Dose by mouth daily., Disp: , Rfl:    phentermine 37.5 MG capsule, TAKE 1 CAPSULE(37.5 MG) BY MOUTH EVERY MORNING, Disp: 30 capsule, Rfl: 5  PAST MEDICAL HISTORY: Past Medical History:  Diagnosis Date   Anemia    during pregnancy   Anxiety    Breast cancer ([place]) 03/24/10   /04/29/10 lumpectomt dcis   MS (multiple sclerosis) ([place])    Multiple sclerosis ([place])    10 years,tx with interferon   Personal history of radiation therapy 2012    PAST SURGICAL HISTORY: Past Surgical History:  Procedure Laterality Date   BREAST BIOPSY Right 04/03/2010   BREAST LUMPECTOMY Right 04/29/10   LAPAROSCOPIC CHOLECYSTECTOMY  07/26/08    FAMILY HISTORY: Family History  Problem Relation Age of Onset   Cancer Father        bladder   Aneurysm Father    Breast cancer Maternal Uncle    Breast cancer Maternal Aunt     SOCIAL HISTORY:  Social History   Socioeconomic History   Marital status: Married    Spouse name: Not on file   Number of children: 1   Years of education: Not on file   Highest education level: Not on file  Occupational History   Occupation: Health visitor: [employer]  Tobacco Use   Smoking status: Former    Packs/day: 0.50    Types: Cigarettes   Smokeless tobacco: Never  Substance and Sexual Activity   Alcohol use: Yes    Comment: socially   Drug use: No    Comment: 10 years,quit 2008    Sexual activity: Not on file  Other Topics Concern   Not on file  Social History Narrative   Not on file   Social Determinants of Health   Financial Resource Strain: Not on file  Food Insecurity: Not on file  Transportation Needs: Not on file  Physical Activity: Not on file  Stress: Not on file  Social Connections: Not on file  Intimate Partner Violence: Not on file     PHYSICAL EXAM  Vitals:   09/04/20 1540  BP: (!) 135/97  Pulse: (!) 101  Weight: 195 lb (88.5 kg)  Height: 5\' 9"  (1.753 m)    Body mass index is 28.8 kg/m.   General: The patient is well-developed and well-nourished and in no acute distress.  Only mild lower back tenderness  Neurologic Exam  Mental status: The patient  is alert and oriented x 3 at the time of the examination. The patient has apparent normal recent and remote memory, with an apparently normal attention span and concentration ability.   Speech is normal.  Cranial nerves: Extraocular movements are full.  Facial strength is normal.  Trapezius strength is normal.  The tongue is midline, and the patient has symmetric elevation of the soft palate. No obvious hearing deficits are noted.  Motor:  Muscle bulk is normal.   Tone is normal. Strength is 5/5 in all extremities except for 4+/5 in the extensor hallucis longus muscle on the left and APB in left hand.  Interossei are fine  Sensory: She has aleft Phalen's sign.   Mildly reduced sensation in the dorsal left foot compared to the right.  Mildly reduced sensation over the thenar eminence on the left   Coordination: Cerebellar testing shows good finger-nose-finger  Gait and station: Station is normal.   Gait is normal.  Tandem gait is mildly wide.  Romberg is negative.  Reflexes: Deep tendon reflexes are symmetric and normal in arms but mildly increased DTR at the left knee.    Multiple sclerosis ([place]) - Plan: Ambulatory referral to [place], CBC with Differential/Platelet, Hepatic  function panel  Dysesthesia - Plan: NCV with EMG(electromyography)  Hx Breast Cancer ([place]) Right, Stage 0, receptor +  High risk medication use - Plan: CBC with Differential/Platelet, Hepatic function panel  Left carpal tunnel syndrome - Plan: NCV with EMG(electromyography)  Neuropathy of left peroneal nerve  Depression with anxiety - Plan: Ambulatory referral to [facility]   1.   Continue Aubagio.   Check labwork 2.   Changes in the hand are more likely to be due to carpal tunnel syndrome or radiculopathy than multiple sclerosis.  We will check an NCV/EMG to further characterize and consider follow-up depending on the results.   3.   She has had more anxiety and depression.  She would prefer to do counseling then to begin the medication and I will make a referral.  If not better she will reconsider an SSRI.   4.   Return in 6 months or sooner if there are new or worsening neurologic symptoms.    Cimberly Stoffel A. Felecia Shelling, MD, PhD, FAAN Certified in Neurology, Clinical Neurophysiology, Sleep Medicine, Pain Medicine and Neuroimaging Director, [place] at [facility] [address] [place], [place] [address] [phone]

## 2020-09-05 LAB — CBC WITH DIFFERENTIAL/PLATELET
Basophils Absolute: 0.1 10*3/uL (ref 0.0–0.2)
Basos: 1 %
EOS (ABSOLUTE): 0 10*3/uL (ref 0.0–0.4)
Eos: 1 %
Hematocrit: 43.3 % (ref 34.0–46.6)
Hemoglobin: 14.7 g/dL (ref 11.1–15.9)
Immature Grans (Abs): 0.1 10*3/uL (ref 0.0–0.1)
Immature Granulocytes: 1 %
Lymphocytes Absolute: 2.2 10*3/uL (ref 0.7–3.1)
Lymphs: 29 %
MCH: 28.6 pg (ref 26.6–33.0)
MCHC: 33.9 g/dL (ref 31.5–35.7)
MCV: 84 fL (ref 79–97)
Monocytes Absolute: 0.9 10*3/uL (ref 0.1–0.9)
Monocytes: 12 %
Neutrophils Absolute: 4.2 10*3/uL (ref 1.4–7.0)
Neutrophils: 56 %
RBC: 5.14 x10E6/uL (ref 3.77–5.28)
RDW: 13.1 % (ref 11.7–15.4)
WBC: 7.4 10*3/uL (ref 3.4–10.8)

## 2020-09-05 LAB — HEPATIC FUNCTION PANEL
ALT: 34 IU/L — ABNORMAL HIGH (ref 0–32)
AST: 23 IU/L (ref 0–40)
Albumin: 5 g/dL — ABNORMAL HIGH (ref 3.8–4.9)
Alkaline Phosphatase: 56 IU/L (ref 44–121)
Bilirubin Total: 0.5 mg/dL (ref 0.0–1.2)
Bilirubin, Direct: 0.16 mg/dL (ref 0.00–0.40)
Total Protein: 7.6 g/dL (ref 6.0–8.5)

## 2020-10-15 ENCOUNTER — Encounter: Payer: Self-pay | Admitting: Neurology

## 2020-10-15 ENCOUNTER — Ambulatory Visit (INDEPENDENT_AMBULATORY_CARE_PROVIDER_SITE_OTHER): Payer: 59 | Admitting: Neurology

## 2020-10-15 DIAGNOSIS — G5602 Carpal tunnel syndrome, left upper limb: Secondary | ICD-10-CM

## 2020-10-15 DIAGNOSIS — R208 Other disturbances of skin sensation: Secondary | ICD-10-CM | POA: Diagnosis not present

## 2020-10-15 DIAGNOSIS — Z0289 Encounter for other administrative examinations: Secondary | ICD-10-CM

## 2020-10-15 NOTE — [place] Notes (Signed)
Full Name: Traci Watkins Gender: Female MRN #: [id number] Date of Birth: Jul 11, 1970    Visit Date: 10/15/2020 14:08 Age: 50 Years Examining Physician: Arlice Colt, MD  Referring Physician: Arlice Colt, MD   History: Traci Watkins is a 50 year old woman with left wrist and hand pain and numbness.  She also has multiple sclerosis.  On exam, strength was 4+/5 in the APB muscle of the left hand and normal elsewhere.  Sensation was normal.  Nerve conduction studies:  The left median sensory response was mildly prolonged in latency with normal amplitude.  The right median and both ulnar sensory responses had normal peak latencies and amplitudes.  The bilateral median and left ulnar motor responses had normal distal latencies, amplitudes and conduction velocities.  Electromyography: Needle EMG of selected muscles of the left arm was performed.  Motor unit morphology and recruitment was normal in all of the muscles tested.  Impression: This NCV/EMG study shows the following: Mild left median neuropathy (carpal tunnel syndrome) No evidence of superimposed cervical radiculopathy.  Traci Watkins A. Traci Regnier, MD, PhD, FAAN Certified in Neurology, Clinical Neurophysiology, Sleep Medicine, Pain Medicine and Neuroimaging Director, [place] at [facility] [address] [place], [place] [address] [phone]  Clinical note: She was a advised to obtain a carpal tunnel wrist splint and wear it at bedtime and leisure activities not involving the arms.  --RAS   Verbal informed consent was obtained from the patient, patient was informed of potential risk of procedure, including bruising, bleeding, hematoma formation, infection, muscle weakness, muscle pain, numbness, among others.        Traci Watkins    Nerve / Sites Muscle Latency Ref. Amplitude Ref. Rel Amp Segments Distance Velocity Ref. Area    ms ms mV mV %  cm m/s m/s mVms  L  Median - APB     Wrist APB 3.5 ?4.4 9.2 ?4.0 100 Wrist - APB 7   32.9     Upper arm APB 7.6  7.7  83.6 Upper arm - Wrist 21 51 ?49 27.1  R Median - APB     Wrist APB 3.1 ?4.4 11.2 ?4.0 100 Wrist - APB 7   44.0     Upper arm APB 7.0  10.3  91.4 Upper arm - Wrist 21 54 ?49 38.6  L Ulnar - ADM     Wrist ADM 2.9 ?3.3 8.8 ?6.0 100 Wrist - ADM 7   37.9     B.Elbow ADM 6.1  7.3  82.9 B.Elbow - Wrist 19 58 ?49 34.3     A.Elbow ADM 8.0  7.5  102 A.Elbow - B.Elbow 10 53 ?49 35.7           SNC    Nerve / Sites Rec. Site Peak Lat Ref.  Amp Ref. Segments Distance Peak Diff Ref.    ms ms V V  cm ms ms  L Median, Ulnar - Transcarpal comparison     Median Palm Wrist 2.5 ?2.2 65 ?35 Median Palm - Wrist 8       Ulnar Palm Wrist 2.0 ?2.2 21 ?12 Ulnar Palm - Wrist 8          Median Palm - Ulnar Palm  0.6 ?0.4  R Median, Ulnar - Transcarpal comparison     Median Palm Wrist 2.2 ?2.2 64 ?35 Median Palm - Wrist 8       Ulnar Palm Wrist 2.0 ?2.2 19 ?12  Ulnar Palm - Wrist 8          Median Palm - Ulnar Palm  0.2 ?0.4  L Median - Orthodromic (Dig II, Mid palm)     Dig II Wrist 3.6 ?3.4 21 ?10 Dig II - Wrist 13    R Median - Orthodromic (Dig II, Mid palm)     Dig II Wrist 3.2 ?3.4 17 ?10 Dig II - Wrist 13    L Ulnar - Orthodromic, (Dig V, Mid palm)     Dig V Wrist 2.6 ?3.1 6 ?5 Dig V - Wrist 67                 F  Wave    Nerve F Lat Ref.   ms ms  L Ulnar - ADM 28.2 ?32.0       EMG Summary Table    Spontaneous MUAP Recruitment  Muscle IA Fib PSW Fasc Other Amp Dur. Poly Pattern  L. Deltoid Normal None None None _______ Normal Normal Normal Normal  L. Triceps brachii Normal None None None _______ Normal Normal Normal Normal  L. Biceps brachii Normal None None None _______ Normal Normal Normal Normal  L. Extensor digitorum communis Normal None None None _______ Normal Normal Normal Normal  L. First dorsal interosseous Normal None None None _______ Normal Normal Normal Normal  L. Abductor pollicis  brevis Normal None None None _______ Normal Normal Normal Normal

## 2020-10-23 ENCOUNTER — Ambulatory Visit (INDEPENDENT_AMBULATORY_CARE_PROVIDER_SITE_OTHER): Payer: 59 | Admitting: Psychologist

## 2020-10-23 DIAGNOSIS — F320 Major depressive disorder, single episode, mild: Secondary | ICD-10-CM

## 2020-10-23 DIAGNOSIS — Z634 Disappearance and death of family member: Secondary | ICD-10-CM | POA: Diagnosis not present

## 2020-11-07 ENCOUNTER — Ambulatory Visit (INDEPENDENT_AMBULATORY_CARE_PROVIDER_SITE_OTHER): Payer: 59 | Admitting: Psychologist

## 2020-11-07 ENCOUNTER — Other Ambulatory Visit: Payer: Self-pay

## 2020-11-07 DIAGNOSIS — F411 Generalized anxiety disorder: Secondary | ICD-10-CM

## 2020-11-07 DIAGNOSIS — F320 Major depressive disorder, single episode, mild: Secondary | ICD-10-CM | POA: Diagnosis not present

## 2020-11-07 DIAGNOSIS — Z634 Disappearance and death of family member: Secondary | ICD-10-CM | POA: Diagnosis not present

## 2020-11-18 ENCOUNTER — Other Ambulatory Visit: Payer: Self-pay | Admitting: Neurology

## 2020-11-19 MED ORDER — PHENTERMINE HCL 37.5 MG PO CAPS: ORAL_CAPSULE | ORAL | 0 refills | Status: DC

## 2020-11-19 NOTE — Addendum Note (Signed)
Addended by: Georgiann Cocker on: 11/19/2020 07:56 AM   Modules accepted: Orders

## 2020-11-19 NOTE — Telephone Encounter (Signed)
Received refill request for phentermine.  Last OV was on 09/04/20.  Next OV is scheduled for 04/09/21 .  Last RX was written on 10/14/20 for 30 tabs.   [place] [facility] Database has been reviewed.

## 2020-12-18 ENCOUNTER — Ambulatory Visit: Payer: 59 | Admitting: Psychologist

## 2020-12-24 ENCOUNTER — Other Ambulatory Visit: Payer: Self-pay | Admitting: Neurology

## 2020-12-24 NOTE — Telephone Encounter (Signed)
Received refill request for phentermine.  Last OV was on 09/04/20.  Next OV is scheduled for 04/09/21 .  Last RX was written on 11/19/20 for 30 tabs.   [place] [facility] Database has been reviewed.

## 2021-02-02 ENCOUNTER — Other Ambulatory Visit: Payer: Self-pay | Admitting: Obstetrics and Gynecology

## 2021-02-02 DIAGNOSIS — Z1231 Encounter for screening mammogram for malignant neoplasm of breast: Secondary | ICD-10-CM

## 2021-02-28 ENCOUNTER — Other Ambulatory Visit: Payer: Self-pay | Admitting: Neurology

## 2021-02-28 DIAGNOSIS — G35 Multiple sclerosis: Secondary | ICD-10-CM

## 2021-03-02 ENCOUNTER — Other Ambulatory Visit: Payer: Self-pay

## 2021-03-02 DIAGNOSIS — G35 Multiple sclerosis: Secondary | ICD-10-CM

## 2021-03-02 MED ORDER — AUBAGIO 14 MG PO TABS: 1.0000 | ORAL_TABLET | Freq: Every day | ORAL | 5 refills | Status: DC

## 2021-03-03 ENCOUNTER — Ambulatory Visit
Admission: RE | Admit: 2021-03-03 | Discharge: 2021-03-03 | Disposition: A | Discharge status: Home / Self Care | Payer: 59 | Source: Ambulatory Visit | Attending: Obstetrics and Gynecology | Admitting: Obstetrics and Gynecology

## 2021-03-03 DIAGNOSIS — Z1231 Encounter for screening mammogram for malignant neoplasm of breast: Secondary | ICD-10-CM

## 2021-03-24 ENCOUNTER — Encounter: Payer: Self-pay | Admitting: Neurology

## 2021-03-24 ENCOUNTER — Ambulatory Visit: Payer: 59 | Admitting: Neurology

## 2021-03-24 VITALS — BP 128/92 | HR 98 | Ht 69.0 in | Wt 199.5 lb

## 2021-03-24 DIAGNOSIS — C50911 Malignant neoplasm of unspecified site of right female breast: Secondary | ICD-10-CM

## 2021-03-24 DIAGNOSIS — R208 Other disturbances of skin sensation: Secondary | ICD-10-CM

## 2021-03-24 DIAGNOSIS — G5602 Carpal tunnel syndrome, left upper limb: Secondary | ICD-10-CM | POA: Diagnosis not present

## 2021-03-24 DIAGNOSIS — G35 Multiple sclerosis: Secondary | ICD-10-CM | POA: Diagnosis not present

## 2021-03-24 DIAGNOSIS — Z79899 Other long term (current) drug therapy: Secondary | ICD-10-CM

## 2021-03-24 DIAGNOSIS — F418 Other specified anxiety disorders: Secondary | ICD-10-CM

## 2021-03-24 NOTE — Progress Notes (Signed)
[facility]  PATIENT: Traci Watkins DOB: 1970-09-11  REFERRING DOCTOR OR PCP:  Carlos Levering [facility]) SOURCE: patient and EMR records  _________________________________   HISTORICAL  CHIEF COMPLAINT:  Chief Complaint  Patient presents with   Follow-up    Rm 1, alone. Here for 7 month MS f/u, on Aubagio and tolerating well. No new or worsening in sx.     HISTORY OF PRESENT ILLNESS:  Traci Watkins is a 51 y.o. woman with relapsing remitting multiple sclerosis.     Update 03/24/2021: Since the last visit, she had a NCV/EMG showing a mild left CTS.  No radiculopathy noted.   Of notes she was experiencing left > right hand numbness.     She is on Aubagio and tolerate it well.   She has no exacerbation recently.    She had stopped Betaseron in 2009 and had several stable MRIs.  However, MRI in 2019 showed one new lesion not present in 2016 and she started the Aubagio then.   The 2021 MRi showed no new lesions.    Gait is doing well. Balance is good.   She has numbness in both feet, left slightly more than right.   She also has some hand numbness, left > right.  She denies weakness in proximal legs but the left toe extension is weak.    Bladder function is fine - occasional urgency but no incontinence.    Vision is stable.   She had a detached retina and needed a buckling procedure  She has some fatigue.   Phentermine helped but due to BP icrease she stopped it.    She actually gained some weiht while on phentermine  She thinks it helped fatigue some.   She is sleeping well most nights.   Cognition is fine.     She has had mild depression and more anxiety in 2021 with  her father passing.  This is better now.    She has breast cancer (2012) but has not had any recurrence.    MS history:  In 2000 presented with right facial numbness and later that year had left leg numbness. An MRI and lumbar puncture were performed. She was diagnosed with probable multiple  sclerosis. One year later and another MRI was performed that showed additional lesions. She then was diagnosed with clinically definite multiple sclerosis. She was placed on the Betaseron she stopped Betaseron around 2009 due to side effects of the medication. Clinically, she had done well with no exacerbations.   At that time, she had had several stable MRIs during the interim additionally, she has had no further clinical relapses. We made an agreement that she will continue to get occasional MRI monitoring to make sure that there was not subclinical progression of her disease. He rlast MRI was Fortunately, several more MRIs over the last 6 years have shown no further disease activity. Additionally, she has had no clinical relapses and no progression of disability.  IMAGING: MRI brain 04/24/2019 shows several small T2/FLAIR hyperintense foci in the juxtacortical and periventricular white matter of the hemispheres.  None of the foci appears to be acute and they were all present on the previous MRI from 2019.  MRI thoracic spine 04/24/2019 shows a normal spinal cord and mild DDD   MRI cervical spine 09/16/2014 shows a normal spinal cord.  NCV/EMG 10/15/2020 shows the following: Mild left median neuropathy (carpal tunnel syndrome) No evidence of superimposed cervical radiculopathy.  NCV/EMG 11/02/2018 study showed mild left peroneal  neuropathy at or above the fibular head but there did not appear to be any neurologic issue on the right side and there was no superimposed radiculopathy.  There was no generalized polyneuropathy.  FH:  Her cousin was diagnosed with MS at around age 25.   REVIEW OF SYSTEMS: Constitutional: No fevers, chills, sweats, or change in appetite Eyes: No visual changes, double vision, eye pain Ear, nose and throat: No hearing loss, ear pain, nasal congestion, sore throat Cardiovascular: No chest pain, palpitations Respiratory:  No shortness of breath at rest or with exertion.   No  wheezes GastrointestinaI: No nausea, vomiting, diarrhea, abdominal pain, fecal incontinence Genitourinary:  No dysuria, urinary retention or frequency.  No nocturia. Musculoskeletal:  No neck pain, back pain Integumentary: No rash, pruritus, skin lesions Neurological: as above Psychiatric: No depression at this time.  Some anxiety Endocrine: No palpitations, diaphoresis, change in appetite, change in weigh or increased thirst Hematologic/Lymphatic:  No anemia, purpura, petechiae. Allergic/Immunologic: No itchy/runny eyes, nasal congestion, recent allergic reactions, rashes  ALLERGIES: No Known Allergies  HOME MEDICATIONS:  Current Outpatient Medications:    cholecalciferol (VITAMIN D) 1000 units tablet, Take 5,000 Units by mouth daily., Disp: , Rfl:    Cyanocobalamin (VITAMIN B-12 PO), Take 1 tablet by mouth daily., Disp: , Rfl:    losartan (COZAAR) 25 MG tablet, Take 25 mg by mouth daily., Disp: , Rfl:    Omega-3 Fatty Acids (OMEGA 3 PO), Take 1 Dose by mouth daily., Disp: , Rfl:    Teriflunomide (AUBAGIO) 14 MG TABS, Take 1 tablet by mouth daily., Disp: 30 tablet, Rfl: 5  PAST MEDICAL HISTORY: Past Medical History:  Diagnosis Date   Anemia    during pregnancy   Anxiety    Breast cancer ([place]) 03/24/10   /04/29/10 lumpectomt dcis   MS (multiple sclerosis) ([place])    Multiple sclerosis ([place])    10 years,tx with interferon   Personal history of radiation therapy 2012    PAST SURGICAL HISTORY: Past Surgical History:  Procedure Laterality Date   BREAST BIOPSY Right 04/03/2010   BREAST LUMPECTOMY Right 04/29/10   LAPAROSCOPIC CHOLECYSTECTOMY  07/26/08    FAMILY HISTORY: Family History  Problem Relation Age of Onset   Cancer Father        bladder   Aneurysm Father    Breast cancer Maternal Aunt     SOCIAL HISTORY:  Social History   Socioeconomic History   Marital status: Married    Spouse name: Not on file   Number of children: 1   Years of education: Not on file    Highest education level: Not on file  Occupational History   Occupation: Health visitor: [employer]  Tobacco Use   Smoking status: Former    Packs/day: 0.50    Types: Cigarettes   Smokeless tobacco: Never  Substance and Sexual Activity   Alcohol use: Yes    Comment: socially   Drug use: No    Comment: 10 years,quit 2008   Sexual activity: Not on file  Other Topics Concern   Not on file  Social History Narrative   Not on file   Social Determinants of Health   Financial Resource Strain: Not on file  Food Insecurity: Not on file  Transportation Needs: Not on file  Physical Activity: Not on file  Stress: Not on file  Social Connections: Not on file  Intimate Partner Violence: Not on file     PHYSICAL EXAM  Vitals:  03/24/21 1531  BP: (!) 128/92  Pulse: 98  SpO2: 96%  Weight: 199 lb 8 oz (90.5 kg)  Height: 5\' 9"  (1.753 m)    Body mass index is 29.46 kg/m.   General: The patient is well-developed and well-nourished and in no acute distress.  Only mild lower back tenderness  Neurologic Exam  Mental status: The patient is alert and oriented x 3 at the time of the examination. The patient has apparent normal recent and remote memory, with an apparently normal attention span and concentration ability.   Speech is normal.  Cranial nerves: Extraocular movements are full.  Facial strength is normal.  Trapezius strength is normal.  The tongue is midline, and the patient has symmetric elevation of the soft palate. No obvious hearing deficits are noted.  Motor:  Muscle bulk is normal.   Tone is normal. Strength is 5/5 in all extremities except for 4+/5 in the extensor hallucis longus muscle on the left and APB in left hand.  Interossei are fine  Sensory: She has a left Phalen's sign.   Mildly reduced vib sensation in left leg relative to the right  Coordination: Cerebellar testing shows good finger-nose-finger and heel to shin  Gait and station: Station  is normal.   Gait is normal.  Tandem gait is mildly wide.  Romberg is negative.  Reflexes: Deep tendon reflexes are symmetric and normal in arms but mildly increased DTR at the left knee.    Multiple sclerosis (HCC)  Dysesthesia  Left carpal tunnel syndrome  High risk medication use  Depression with anxiety  Malignant neoplasm of right female breast, unspecified estrogen receptor status, unspecified site of breast ([place])   1.   Continue Aubagio.   LFTs were fine 03/10/2021 ([facility]).   Check MRI brain, if subclinical progression will consider a different DMT.   2.   Stay active and exercise as tolerate 3.   Anxiety and depression seem better.   If .worsens she will reconsider an SSRI.   4.   Return in 6 months or sooner if there are new or worsening neurologic symptoms.    Yordan Martindale A. Felecia Shelling, MD, PhD, FAAN Certified in Neurology, Clinical Neurophysiology, Sleep Medicine, Pain Medicine and Neuroimaging Director, [place] at [facility] [address], [place], [place] [address] [phone]

## 2021-03-25 ENCOUNTER — Telehealth: Payer: Self-pay | Admitting: Neurology

## 2021-03-25 DIAGNOSIS — G35 Multiple sclerosis: Secondary | ICD-10-CM

## 2021-03-25 DIAGNOSIS — R208 Other disturbances of skin sensation: Secondary | ICD-10-CM

## 2021-03-25 NOTE — Telephone Encounter (Signed)
LVM for pt to call back to schedule  [facility] auth: [id number] (exp. 03/25/21 to 05/09/21)

## 2021-03-30 NOTE — Telephone Encounter (Signed)
MR Brain wo contrast Dr. Felecia Shelling [facility] Josem Kaufmann: [id number] (exp. 03/25/21 to 05/09/21). Patient is scheduled at [facility] for 04/08/21.

## 2021-03-30 NOTE — Addendum Note (Signed)
Addended by: Wyvonnia Lora on: 03/30/2021 10:33 AM   Modules accepted: Orders

## 2021-03-30 NOTE — Telephone Encounter (Signed)
Patient returned my call and she stated that she has had a reaction to the contrast before and stated that Dr. Felecia Shelling did not order it with contrast he last time. Is she able to have it without contrast this time?

## 2021-04-01 ENCOUNTER — Telehealth: Payer: Self-pay | Admitting: *Deleted

## 2021-04-01 NOTE — Telephone Encounter (Signed)
Submitted PA Aubagio on CMM. KeyTerence Lux - PA Case ID: [id number]. Waiting on determination from [facility].

## 2021-04-01 NOTE — Telephone Encounter (Signed)
Request Reference Number: [id number]. AUBAGIO TAB 14MG  is approved through 04/01/2022. Your patient may now fill this prescription and it will be covered.

## 2021-04-08 ENCOUNTER — Ambulatory Visit: Payer: 59

## 2021-04-08 ENCOUNTER — Encounter: Payer: Self-pay | Admitting: Neurology

## 2021-04-08 DIAGNOSIS — R208 Other disturbances of skin sensation: Secondary | ICD-10-CM | POA: Diagnosis not present

## 2021-04-08 DIAGNOSIS — G35 Multiple sclerosis: Secondary | ICD-10-CM | POA: Diagnosis not present

## 2021-04-09 ENCOUNTER — Ambulatory Visit: Payer: 59 | Admitting: Neurology

## 2021-06-18 ENCOUNTER — Telehealth: Payer: Self-pay | Admitting: Neurology

## 2021-06-18 DIAGNOSIS — G35 Multiple sclerosis: Secondary | ICD-10-CM

## 2021-06-18 MED ORDER — TERIFLUNOMIDE 14 MG PO TABS: 1.0000 | ORAL_TABLET | Freq: Every day | ORAL | 5 refills | Status: DC

## 2021-06-18 NOTE — Telephone Encounter (Signed)
Called and spoke with pt. She has not tried/failed generic.  ? ?Reviewed chart, we have previous PA on file for brand name: "Request Reference Number: [id number]. AUBAGIO TAB '14MG'$  is approved through 04/01/2022. Your patient may now fill this prescription and it will be covered." ? ?She states since generic released in March 2023, [facility] now saying brand not covered unless we get another approval. Aware I will attempt another PA. If not covered, we have option to send rx to [facility] for generic and she can get 90 day supply for 26/27 dollars. She verbalized understanding and appreciation. Aware I will update her once I find out some more info. ? ?I initiated new PA on CMM for brand name Key: [id number]. Waiting on determination from [facility]. ?

## 2021-06-18 NOTE — Addendum Note (Signed)
Addended by: Wyvonnia Lora on: 06/18/2021 03:23 PM ? ? Modules accepted: Orders ? ?

## 2021-06-18 NOTE — Telephone Encounter (Signed)
Took call from phone staff and spoke with pt. PA denied. Insurance requiring she try/fail generic now before continuing to cover brand name. She is agreeable to make switch. E-scribed rx to [facility]. Offered phone # but she declined. She will be on look out for phone call from them. ?

## 2021-06-18 NOTE — Telephone Encounter (Signed)
Pt calling concerning AUBAGIO.  ?[facility] requested pt call asking [place] office to do an appeal AUBAGIO. So pt can continue to take AUBAGIO instead of generic brand.   ?Would like a call back with updates.  ?

## 2021-08-19 ENCOUNTER — Telehealth: Payer: Self-pay | Admitting: Neurology

## 2021-08-19 NOTE — Telephone Encounter (Signed)
Rescheduled 8/15 appointment with pt over the phone- NP out  Pt would also like a refill of her Aubagio medication

## 2021-08-19 NOTE — Telephone Encounter (Signed)
Rx for generic Aubagio sent to [facility] (#30 x 5 on 06/18/21).   I spoke to the patient. She has not heard from the mail order pharmacy yet. Provided the number to [facility]. She will call to set up her profile and schedule delivery. If she runs into any issues, she will call our office back.

## 2021-09-16 ENCOUNTER — Ambulatory Visit: Payer: 59 | Admitting: Neurology

## 2021-09-29 ENCOUNTER — Ambulatory Visit: Payer: 59 | Admitting: Neurology

## 2021-11-04 ENCOUNTER — Ambulatory Visit: Payer: 59 | Admitting: Neurology

## 2021-11-04 ENCOUNTER — Encounter: Payer: Self-pay | Admitting: Neurology

## 2021-11-04 DIAGNOSIS — G35 Multiple sclerosis: Secondary | ICD-10-CM

## 2021-11-04 DIAGNOSIS — R5383 Other fatigue: Secondary | ICD-10-CM

## 2021-11-04 MED ORDER — TERIFLUNOMIDE 14 MG PO TABS: 1.0000 | ORAL_TABLET | Freq: Every day | ORAL | 11 refills | Status: DC

## 2021-11-04 NOTE — Progress Notes (Signed)
Patient: Traci Watkins Date of Birth: [birth year]  Reason for Visit: Follow up for MS History from: Patient Primary Neurologist: Dr. Felecia Shelling   ASSESSMENT AND PLAN 51 y.o. year old female   81.  Multiple sclerosis 2.  Fatigue -Traci Watkins is delightful, seem to be doing well, no recent MS exacerbations -Continue Aubagio, getting ready to switch to generic -Repeating labs with PCP, 10/23/21 platelet count 53 (concern for clumping), ALT 41 -MRI of the brain 04/08/21 was stable with scattered foci, no changes compared to MRI in March 2021 -We talked about considering Provigil for fatigue in the future, previously tried phentermine -She is thinking about weight loss drug options, trying to be more active, striving for exercise  -Follow-up in 6 months or sooner if needed  HISTORY OF PRESENT ILLNESS: Today 11/04/21 Traci Watkins is here today for follow-up.  Remains on Aubagio is about to switch to generic Aubagio.  MRI of the brain in February 2023 was stable with no new plaques.  Labs from [facility] 10/23/21 ALT 41 (had been drinking), AST 26, platelet 53 (suspected clumping), WBC 5.6.  Will repeat in a few weeks. Cholesterol was up LDL 133, cholesterol 202. Hard to lose weight, has fatigue, in menopause. Has stressful job, manages a Associate Professor, getting ready for market. 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 Vision is fine, is nearsighted. Numbness to feet, decreased sensation to touch/temperature. Balance is ok, no falls. Has bladder urgency. Is on Vitamin D supplement.   HISTORY Update 03/24/2021: Since the last visit, she had a NCV/EMG showing a mild left CTS.  No radiculopathy noted.   Of notes she was experiencing left > right hand numbness.      She is on Aubagio and tolerate it well.   She has no exacerbation recently.    She had stopped Betaseron in 2009 and had several stable MRIs.  However, MRI in 2019 showed one new lesion not present in 2016 and she started the Aubagio then.   The 2021 MRi showed no new  lesions.     Gait is doing well. Balance is good.   She has numbness in both feet, left slightly more than right.   She also has some hand numbness, left > right.  She denies weakness in proximal legs but the left toe extension is weak.    Bladder function is fine - occasional urgency but no incontinence.    Vision is stable.   She had a detached retina and needed a buckling procedure   She has some fatigue.   Phentermine helped but due to BP icrease she stopped it.    She actually gained some weiht while on phentermine  She thinks it helped fatigue some.   She is sleeping well most nights.   Cognition is fine.      She has had mild depression and more anxiety in 2021 with  her father passing.  This is better now.     She has breast cancer (2012) but has not had any recurrence.    MS history:  In 2000 presented with right facial numbness and later that year had left leg numbness. An MRI and lumbar puncture were performed. She was diagnosed with probable multiple sclerosis. One year later and another MRI was performed that showed additional lesions. She then was diagnosed with clinically definite multiple sclerosis. She was placed on the Betaseron she stopped Betaseron around 2009 due to side effects of the medication. Clinically, she had done well with no exacerbations.  At that time, she had had several stable MRIs during the interim additionally, she has had no further clinical relapses. We made an agreement that she will continue to get occasional MRI monitoring to make sure that there was not subclinical progression of her disease. He rlast MRI was Fortunately, several more MRIs over the last 6 years have shown no further disease activity. Additionally, she has had no clinical relapses and no progression of disability.   IMAGING: MRI brain 04/24/2019 shows several small T2/FLAIR hyperintense foci in the juxtacortical and periventricular white matter of the hemispheres.  None of the foci appears to  be acute and they were all present on the previous MRI from 2019.   MRI thoracic spine 04/24/2019 shows a normal spinal cord and mild DDD   MRI cervical spine 09/16/2014 shows a normal spinal cord.   NCV/EMG 10/15/2020 shows the following: Mild left median neuropathy (carpal tunnel syndrome) No evidence of superimposed cervical radiculopathy.   NCV/EMG 11/02/2018 study showed mild left peroneal neuropathy at or above the fibular head but there did not appear to be any neurologic issue on the right side and there was no superimposed radiculopathy.  There was no generalized polyneuropathy.   FH:  Her cousin was diagnosed with MS at around age 19.   REVIEW OF SYSTEMS: Out of a complete 14 system review of symptoms, the patient complains only of the following symptoms, and all other reviewed systems are negative.  See HPI  ALLERGIES: No Known Allergies  HOME MEDICATIONS: Outpatient Medications Prior to Visit  Medication Sig Dispense Refill   cholecalciferol (VITAMIN D) 1000 units tablet Take 5,000 Units by mouth daily.     Cyanocobalamin (VITAMIN B-12 PO) Take 1 tablet by mouth daily.     losartan (COZAAR) 25 MG tablet Take 25 mg by mouth daily.     Omega-3 Fatty Acids (OMEGA 3 PO) Take 1 Dose by mouth daily.     Teriflunomide (AUBAGIO) 14 MG TABS Take 1 tablet by mouth daily. 30 tablet 5   No facility-administered medications prior to visit.    PAST MEDICAL HISTORY: Past Medical History:  Diagnosis Date   Anemia    during pregnancy   Anxiety    Breast cancer ([place]) 03/24/10   /04/29/10 lumpectomt dcis   MS (multiple sclerosis) ([place])    Multiple sclerosis ([place])    10 years,tx with interferon   Personal history of radiation therapy 2012    PAST SURGICAL HISTORY: Past Surgical History:  Procedure Laterality Date   BREAST BIOPSY Right 04/03/2010   BREAST LUMPECTOMY Right 04/29/10   LAPAROSCOPIC CHOLECYSTECTOMY  07/26/08    FAMILY HISTORY: Family History  Problem Relation Age of  Onset   Cancer Father        bladder   Aneurysm Father    Breast cancer Maternal Aunt     SOCIAL HISTORY: Social History   Socioeconomic History   Marital status: Married    Spouse name: Not on file   Number of children: 1   Years of education: Not on file   Highest education level: Not on file  Occupational History   Occupation: Health visitor: [employer]  Tobacco Use   Smoking status: Former    Packs/day: 0.50    Types: Cigarettes   Smokeless tobacco: Never  Substance and Sexual Activity   Alcohol use: Yes    Comment: socially   Drug use: No    Comment: 10 years,quit 2008   Sexual activity:  Not on file  Other Topics Concern   Not on file  Social History Narrative   Not on file   Social Determinants of Health   Financial Resource Strain: Not on file  Food Insecurity: Not on file  Transportation Needs: Not on file  Physical Activity: Not on file  Stress: Not on file  Social Connections: Not on file  Intimate Partner Violence: Not on file   PHYSICAL EXAM  Vitals:   11/04/21 1303  BP: 113/79  Pulse: 80  Weight: 206 lb (93.4 kg)  Height: '5\' 9"'$  (1.753 m)   Body mass index is 30.42 kg/m.  Generalized: Well developed, in no acute distress  Neurological examination  Mentation: Alert oriented to time, place, history taking. Follows all commands speech and language fluent Cranial nerve II-XII: Pupils were equal round reactive to light. Extraocular movements were full, visual field were full on confrontational test. Facial sensation and strength were normal. Head turning and shoulder shrug  were normal and symmetric. Motor: The motor testing reveals 5 over 5 strength of all 4 extremities. Good symmetric motor tone is noted throughout.  Sensory: Sensory testing is intact to soft touch on all 4 extremities. No evidence of extinction is noted.  Coordination: Cerebellar testing reveals good finger-nose-finger and heel-to-shin bilaterally.  Gait and  station: Gait is normal. Tandem gait is slightly unsteady. Romberg is negative. No drift is seen.  Reflexes: Deep tendon reflexes are symmetric but increased in the left knee  DIAGNOSTIC DATA (LABS, IMAGING, TESTING) - I reviewed patient records, labs, notes, testing and imaging myself where available.  Lab Results  Component Value Date   WBC 7.4 09/04/2020   HGB 14.7 09/04/2020   HCT 43.3 09/04/2020   MCV 84 09/04/2020   PLT CANCELED 09/04/2020      Component Value Date/Time   NA 139 03/06/2020 1541   NA 140 05/14/2015 1537   K 4.4 03/06/2020 1541   K 4.1 05/14/2015 1537   CL 102 03/06/2020 1541   CL 107 04/03/2012 0847   CO2 26 03/06/2020 1541   CO2 26 05/14/2015 1537   GLUCOSE 120 (H) 03/06/2020 1541   GLUCOSE 114 05/14/2015 1537   GLUCOSE 98 04/03/2012 0847   BUN 13 03/06/2020 1541   BUN 10.7 05/14/2015 1537   CREATININE 0.87 03/06/2020 1541   CREATININE 0.8 05/14/2015 1537   CALCIUM 9.7 03/06/2020 1541   CALCIUM 9.3 05/14/2015 1537   PROT 7.6 09/04/2020 1635   PROT 6.9 05/14/2015 1537   ALBUMIN 5.0 (H) 09/04/2020 1635   ALBUMIN 3.8 05/14/2015 1537   AST 23 09/04/2020 1635   AST 14 05/14/2015 1537   ALT 34 (H) 09/04/2020 1635   ALT 15 05/14/2015 1537   ALKPHOS 56 09/04/2020 1635   ALKPHOS 29 (L) 05/14/2015 1537   BILITOT 0.5 09/04/2020 1635   BILITOT 0.39 05/14/2015 1537   GFRNONAA 77 03/06/2020 1541   GFRAA 89 03/06/2020 1541   No results found for: "CHOL", "HDL", "LDLCALC", "LDLDIRECT", "TRIG", "CHOLHDL" Lab Results  Component Value Date   HGBA1C 5.6 03/06/2020   No results found for: "VITAMINB12" No results found for: "TSH"  Butler Denmark, AGNP-C, DNP 11/04/2021, 1:38 PM [facility] [address], [place], [place] [address] [phone]

## 2021-11-24 ENCOUNTER — Encounter: Payer: Self-pay | Admitting: Neurology

## 2022-01-20 ENCOUNTER — Other Ambulatory Visit: Payer: Self-pay | Admitting: Family Medicine

## 2022-01-20 DIAGNOSIS — Z1231 Encounter for screening mammogram for malignant neoplasm of breast: Secondary | ICD-10-CM

## 2022-03-17 ENCOUNTER — Ambulatory Visit
Admission: RE | Admit: 2022-03-17 | Discharge: 2022-03-17 | Disposition: A | Discharge status: Home / Self Care | Payer: 59 | Source: Ambulatory Visit | Attending: Family Medicine | Admitting: Family Medicine

## 2022-03-17 DIAGNOSIS — Z1231 Encounter for screening mammogram for malignant neoplasm of breast: Secondary | ICD-10-CM

## 2022-05-12 ENCOUNTER — Ambulatory Visit: Payer: 59 | Admitting: Neurology

## 2022-05-12 ENCOUNTER — Encounter: Payer: Self-pay | Admitting: Neurology

## 2022-05-12 VITALS — BP 119/83 | HR 69 | Ht 69.0 in | Wt 208.0 lb

## 2022-05-12 DIAGNOSIS — G35 Multiple sclerosis: Secondary | ICD-10-CM

## 2022-05-12 DIAGNOSIS — R5383 Other fatigue: Secondary | ICD-10-CM

## 2022-05-12 MED ORDER — TERIFLUNOMIDE 14 MG PO TABS: 1.0000 | ORAL_TABLET | Freq: Every day | ORAL | 11 refills | Status: DC

## 2022-05-12 MED ORDER — MODAFINIL 200 MG PO TABS: 200.0000 mg | ORAL_TABLET | Freq: Every morning | ORAL | 5 refills | Status: DC

## 2022-05-12 NOTE — Patient Instructions (Signed)
Try Provigil for fatigue, start taking 1/2 tablet in the morning, can increase to 1 tablet if need for more energy   Check labs, stay on Aubagio

## 2022-05-12 NOTE — Progress Notes (Signed)
Patient: Traci Watkins Date of Birth: [birth year]  Reason for Visit: Follow up for MS History from: Patient Primary Neurologist: Dr. Felecia Shelling   ASSESSMENT AND PLAN 52 y.o. year old female   83.  Multiple sclerosis 2.  Fatigue -She remains overall stable, will continue generic Aubagio -I will repeat CBC with differential, CMP, will evaluate for low platelet, discuss with Dr. Felecia Shelling if contribution from [place]? started around 2019 when starting Aubagio -Try Provigil 200 mg daily for fatigue, start taking 1/2 tablet, increase to full tablet if needed  -MRI of the brain 04/08/21 was stable with scattered foci, no changes compared to MRI in March 2021 -Follow-up in 6 months or sooner if needed  HISTORY OF PRESENT ILLNESS: Today 05/12/22 She is doing well. Remains on generic Aubagio. MS is stable, no new issues.  Continues with fatigue.  Continues to work full-time for [employer].  Denies any changes to the vision, arms and legs are doing fine, bowel and bladder are okay.  She does have a left Achilles issue.  Has had a low platelet level in September 2023 was 53, repeated was canceled, looking back present since around 2019.  Update 11/04/21 SS: Traci Watkins is here today for follow-up.  Remains on Aubagio is about to switch to generic Aubagio.  MRI of the brain in February 2023 was stable with no new plaques.  Labs from [facility] 10/23/21 ALT 41 (had been drinking), AST 26, platelet 53 (suspected clumping), WBC 5.6.  Will repeat in a few weeks. Cholesterol was up LDL 133, cholesterol 202. Hard to lose weight, has fatigue, in menopause. Has stressful job, manages a Associate Professor, getting ready for market. 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 Vision is fine, is nearsighted. Numbness to feet, decreased sensation to touch/temperature. Balance is ok, no falls. Has bladder urgency. Is on Vitamin D supplement.   HISTORY Update 03/24/2021: Since the last visit, she had a NCV/EMG showing a mild left CTS.  No  radiculopathy noted.   Of notes she was experiencing left > right hand numbness.      She is on Aubagio and tolerate it well.   She has no exacerbation recently.    She had stopped Betaseron in 2009 and had several stable MRIs.  However, MRI in 2019 showed one new lesion not present in 2016 and she started the Aubagio then.   The 2021 MRi showed no new lesions.     Gait is doing well. Balance is good.   She has numbness in both feet, left slightly more than right.   She also has some hand numbness, left > right.  She denies weakness in proximal legs but the left toe extension is weak.    Bladder function is fine - occasional urgency but no incontinence.    Vision is stable.   She had a detached retina and needed a buckling procedure   She has some fatigue.   Phentermine helped but due to BP icrease she stopped it.    She actually gained some weiht while on phentermine  She thinks it helped fatigue some.   She is sleeping well most nights.   Cognition is fine.      She has had mild depression and more anxiety in 2021 with  her father passing.  This is better now.     She has breast cancer (2012) but has not had any recurrence.    MS history:  In 2000 presented with right facial numbness and later that year had  left leg numbness. An MRI and lumbar puncture were performed. She was diagnosed with probable multiple sclerosis. One year later and another MRI was performed that showed additional lesions. She then was diagnosed with clinically definite multiple sclerosis. She was placed on the Betaseron she stopped Betaseron around 2009 due to side effects of the medication. Clinically, she had done well with no exacerbations.   At that time, she had had several stable MRIs during the interim additionally, she has had no further clinical relapses. We made an agreement that she will continue to get occasional MRI monitoring to make sure that there was not subclinical progression of her disease. He rlast MRI was  Fortunately, several more MRIs over the last 6 years have shown no further disease activity. Additionally, she has had no clinical relapses and no progression of disability.   IMAGING: MRI brain 04/24/2019 shows several small T2/FLAIR hyperintense foci in the juxtacortical and periventricular white matter of the hemispheres.  None of the foci appears to be acute and they were all present on the previous MRI from 2019.   MRI thoracic spine 04/24/2019 shows a normal spinal cord and mild DDD   MRI cervical spine 09/16/2014 shows a normal spinal cord.   NCV/EMG 10/15/2020 shows the following: Mild left median neuropathy (carpal tunnel syndrome) No evidence of superimposed cervical radiculopathy.   NCV/EMG 11/02/2018 study showed mild left peroneal neuropathy at or above the fibular head but there did not appear to be any neurologic issue on the right side and there was no superimposed radiculopathy.  There was no generalized polyneuropathy.   FH:  Her cousin was diagnosed with MS at around age 32.   REVIEW OF SYSTEMS: Out of a complete 14 system review of symptoms, the patient complains only of the following symptoms, and all other reviewed systems are negative.  See HPI  ALLERGIES: No Known Allergies  HOME MEDICATIONS: Outpatient Medications Prior to Visit  Medication Sig Dispense Refill   cholecalciferol (VITAMIN D) 1000 units tablet Take 5,000 Units by mouth daily.     Cyanocobalamin (VITAMIN B-12 PO) Take 1 tablet by mouth daily.     losartan (COZAAR) 25 MG tablet Take 25 mg by mouth daily.     Omega-3 Fatty Acids (OMEGA 3 PO) Take 1 Dose by mouth daily.     Teriflunomide (AUBAGIO) 14 MG TABS Take 1 tablet by mouth daily. 30 tablet 11   No facility-administered medications prior to visit.    PAST MEDICAL HISTORY: Past Medical History:  Diagnosis Date   Anemia    during pregnancy   Anxiety    Breast cancer ([place]) 03/24/10   /04/29/10 lumpectomt dcis   MS (multiple sclerosis) ([place])     Multiple sclerosis ([place])    10 years,tx with interferon   Personal history of radiation therapy 2012    PAST SURGICAL HISTORY: Past Surgical History:  Procedure Laterality Date   BREAST BIOPSY Right 04/03/2010   BREAST LUMPECTOMY Right 04/29/10   LAPAROSCOPIC CHOLECYSTECTOMY  07/26/08    FAMILY HISTORY: Family History  Problem Relation Age of Onset   Cancer Father        bladder   Aneurysm Father    Breast cancer Maternal Aunt     SOCIAL HISTORY: Social History   Socioeconomic History   Marital status: Married    Spouse name: Not on file   Number of children: 1   Years of education: Not on file   Highest education level: Not on file  Occupational  History   Occupation: Health visitor: [employer]  Tobacco Use   Smoking status: Former    Packs/day: .5    Types: Cigarettes   Smokeless tobacco: Never  Substance and Sexual Activity   Alcohol use: Yes    Comment: socially   Drug use: No    Comment: 10 years,quit 2008   Sexual activity: Not on file  Other Topics Concern   Not on file  Social History Narrative   Not on file   Social Determinants of Health   Financial Resource Strain: Not on file  Food Insecurity: Not on file  Transportation Needs: Not on file  Physical Activity: Not on file  Stress: Not on file  Social Connections: Not on file  Intimate Partner Violence: Not on file   PHYSICAL EXAM  Vitals:   05/12/22 1005  BP: 119/83  Pulse: 69  Weight: 208 lb (94.3 kg)  Height: 5\' 9"  (1.753 m)    Body mass index is 30.72 kg/m.  Generalized: Well developed, in no acute distress  Neurological examination  Mentation: Alert oriented to time, place, history taking. Follows all commands speech and language fluent Cranial nerve II-XII: Pupils were equal round reactive to light. Extraocular movements were full, visual field were full on confrontational test. Facial sensation and strength were normal. Head turning and shoulder shrug  were  normal and symmetric. Motor: The motor testing reveals 5 over 5 strength of all 4 extremities. Good symmetric motor tone is noted throughout.  Sensory: Sensory testing is intact to soft touch on all 4 extremities. No evidence of extinction is noted.  Coordination: Cerebellar testing reveals good finger-nose-finger and heel-to-shin bilaterally.  Gait and station: Gait is normal. Tandem gait is slightly unsteady.  Reflexes: Deep tendon reflexes are symmetric but increased in the left knee  DIAGNOSTIC DATA (LABS, IMAGING, TESTING) - I reviewed patient records, labs, notes, testing and imaging myself where available.  Lab Results  Component Value Date   WBC 7.4 09/04/2020   HGB 14.7 09/04/2020   HCT 43.3 09/04/2020   MCV 84 09/04/2020   PLT CANCELED 09/04/2020      Component Value Date/Time   NA 139 03/06/2020 1541   NA 140 05/14/2015 1537   K 4.4 03/06/2020 1541   K 4.1 05/14/2015 1537   CL 102 03/06/2020 1541   CL 107 04/03/2012 0847   CO2 26 03/06/2020 1541   CO2 26 05/14/2015 1537   GLUCOSE 120 (H) 03/06/2020 1541   GLUCOSE 114 05/14/2015 1537   GLUCOSE 98 04/03/2012 0847   BUN 13 03/06/2020 1541   BUN 10.7 05/14/2015 1537   CREATININE 0.87 03/06/2020 1541   CREATININE 0.8 05/14/2015 1537   CALCIUM 9.7 03/06/2020 1541   CALCIUM 9.3 05/14/2015 1537   PROT 7.6 09/04/2020 1635   PROT 6.9 05/14/2015 1537   ALBUMIN 5.0 (H) 09/04/2020 1635   ALBUMIN 3.8 05/14/2015 1537   AST 23 09/04/2020 1635   AST 14 05/14/2015 1537   ALT 34 (H) 09/04/2020 1635   ALT 15 05/14/2015 1537   ALKPHOS 56 09/04/2020 1635   ALKPHOS 29 (L) 05/14/2015 1537   BILITOT 0.5 09/04/2020 1635   BILITOT 0.39 05/14/2015 1537   GFRNONAA 77 03/06/2020 1541   GFRAA 89 03/06/2020 1541   No results found for: "CHOL", "HDL", "LDLCALC", "LDLDIRECT", "TRIG", "CHOLHDL" Lab Results  Component Value Date   HGBA1C 5.6 03/06/2020   No results found for: "VITAMINB12" No results found for: "TSH"  Butler Denmark,  AGNP-C, DNP 05/12/2022, 11:18 AM [facility] [address] [place], [place] [address] [phone]

## 2022-05-13 LAB — CBC WITH DIFFERENTIAL/PLATELET

## 2022-05-14 LAB — COMPREHENSIVE METABOLIC PANEL
ALT: 32 IU/L (ref 0–32)
AST: 25 IU/L (ref 0–40)
Albumin/Globulin Ratio: 1.7 (ref 1.2–2.2)
Albumin: 4.3 g/dL (ref 3.8–4.9)
Alkaline Phosphatase: 46 IU/L (ref 44–121)
BUN/Creatinine Ratio: 14 (ref 9–23)
BUN: 10 mg/dL (ref 6–24)
Bilirubin Total: 0.6 mg/dL (ref 0.0–1.2)
CO2: 24 mmol/L (ref 20–29)
Calcium: 9.7 mg/dL (ref 8.7–10.2)
Chloride: 102 mmol/L (ref 96–106)
Creatinine, Ser: 0.69 mg/dL (ref 0.57–1.00)
Globulin, Total: 2.6 g/dL (ref 1.5–4.5)
Glucose: 106 mg/dL — ABNORMAL HIGH (ref 70–99)
Potassium: 4.5 mmol/L (ref 3.5–5.2)
Sodium: 140 mmol/L (ref 134–144)
Total Protein: 6.9 g/dL (ref 6.0–8.5)
eGFR: 104 mL/min/{1.73_m2} (ref >59)

## 2022-05-14 LAB — CBC WITH DIFFERENTIAL/PLATELET
Basophils Absolute: 0.1 10*3/uL (ref 0.0–0.2)
Basos: 2 %
EOS (ABSOLUTE): 0 10*3/uL (ref 0.0–0.4)
Eos: 1 %
Hematocrit: 43.3 % (ref 34.0–46.6)
Hemoglobin: 14.3 g/dL (ref 11.1–15.9)
Immature Grans (Abs): 0 10*3/uL (ref 0.0–0.1)
Immature Granulocytes: 0 %
Lymphocytes Absolute: 1.4 10*3/uL (ref 0.7–3.1)
Lymphs: 28 %
MCH: 28.6 pg (ref 26.6–33.0)
MCHC: 33 g/dL (ref 31.5–35.7)
MCV: 87 fL (ref 79–97)
Monocytes Absolute: 0.5 10*3/uL (ref 0.1–0.9)
Monocytes: 11 %
Neutrophils Absolute: 2.8 10*3/uL (ref 1.4–7.0)
Neutrophils: 58 %
RBC: 5 x10E6/uL (ref 3.77–5.28)
RDW: 12.7 % (ref 11.7–15.4)
WBC: 4.8 10*3/uL (ref 3.4–10.8)

## 2022-05-18 ENCOUNTER — Encounter: Payer: Self-pay | Admitting: Neurology

## 2022-05-18 DIAGNOSIS — G35 Multiple sclerosis: Secondary | ICD-10-CM

## 2022-05-21 IMAGING — MG MM DIGITAL SCREENING BILAT W/ TOMO AND CAD
8 series · 9 of 24 positions shown · non-contrast
Comparison: Previous exam(s).

CLINICAL DATA: Screening.

EXAM:
DIGITAL SCREENING BILATERAL MAMMOGRAM WITH TOMOSYNTHESIS AND CAD
TECHNIQUE: Bilateral screening digital craniocaudal and mediolateral oblique
mammograms were obtained. Bilateral screening digital breast
tomosynthesis was performed. The images were evaluated with
computer-aided detection.

[L CC synth-2D]
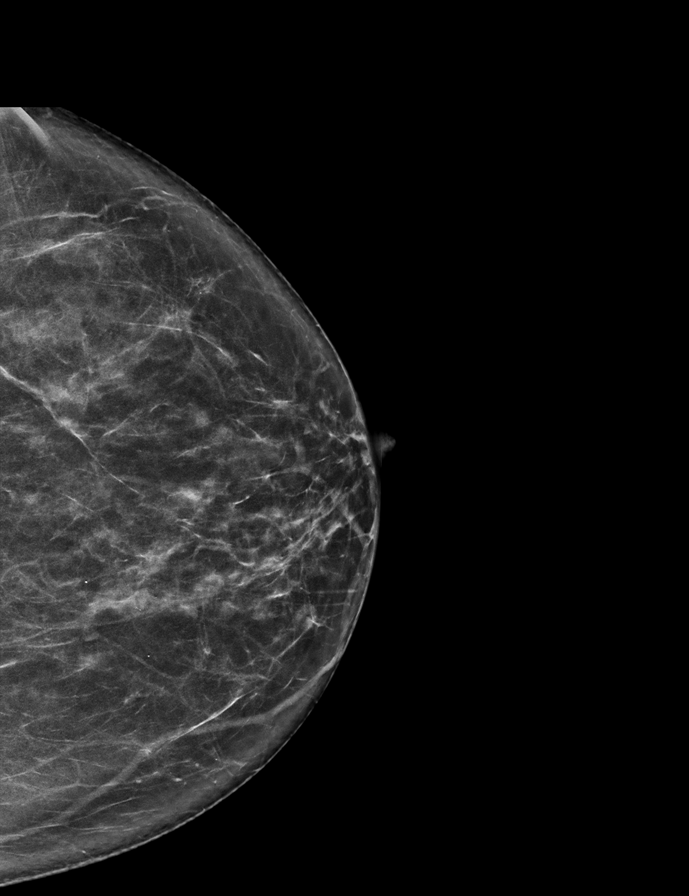

[R CC synth-2D]
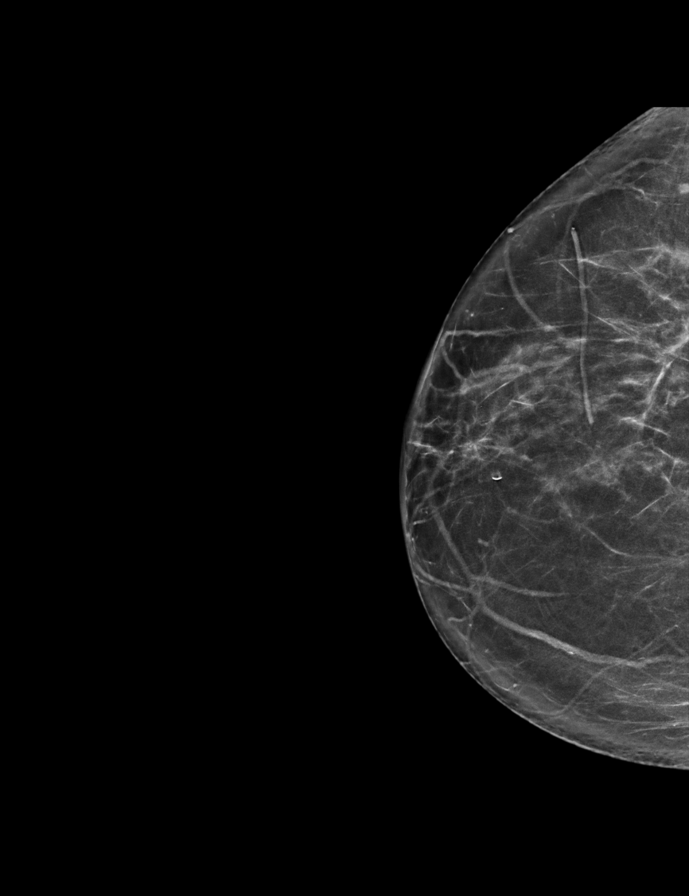

[R MLO synth-2D]
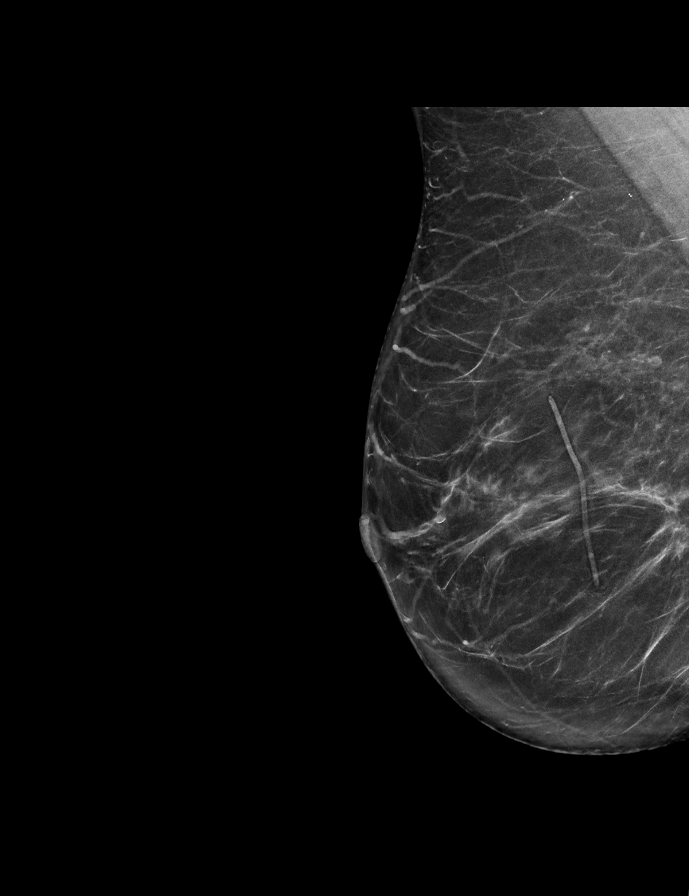

[L MLO synth-2D]
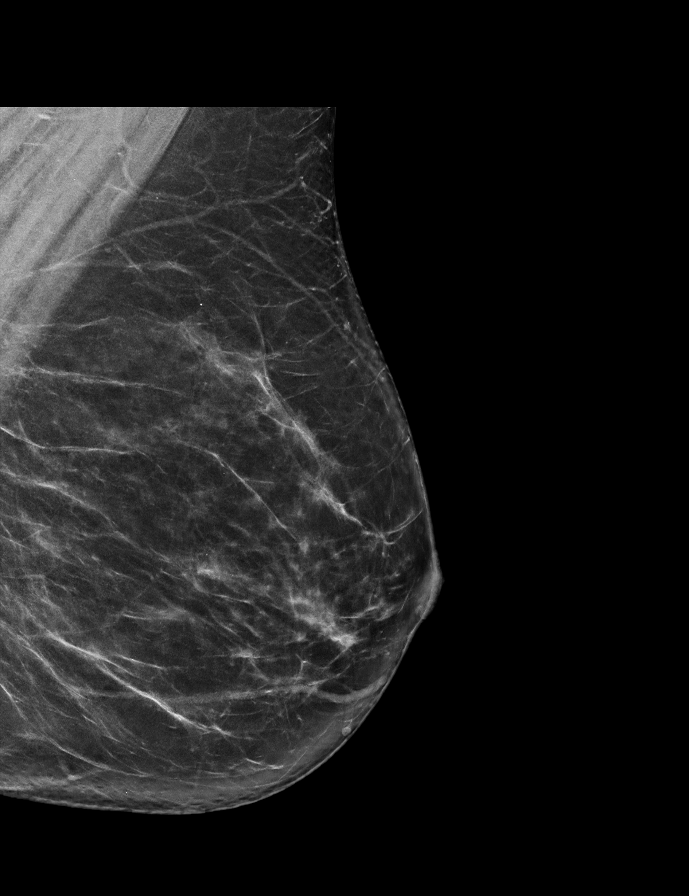

[L MLO tomo · 2 of 77 frames shown]
[frame 25/77]
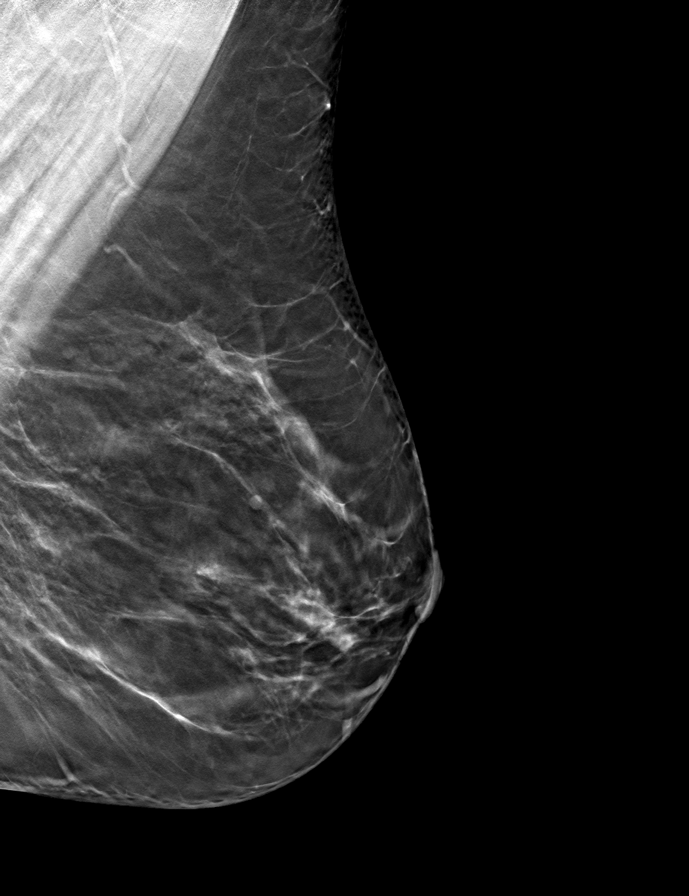
[frame 39/77]
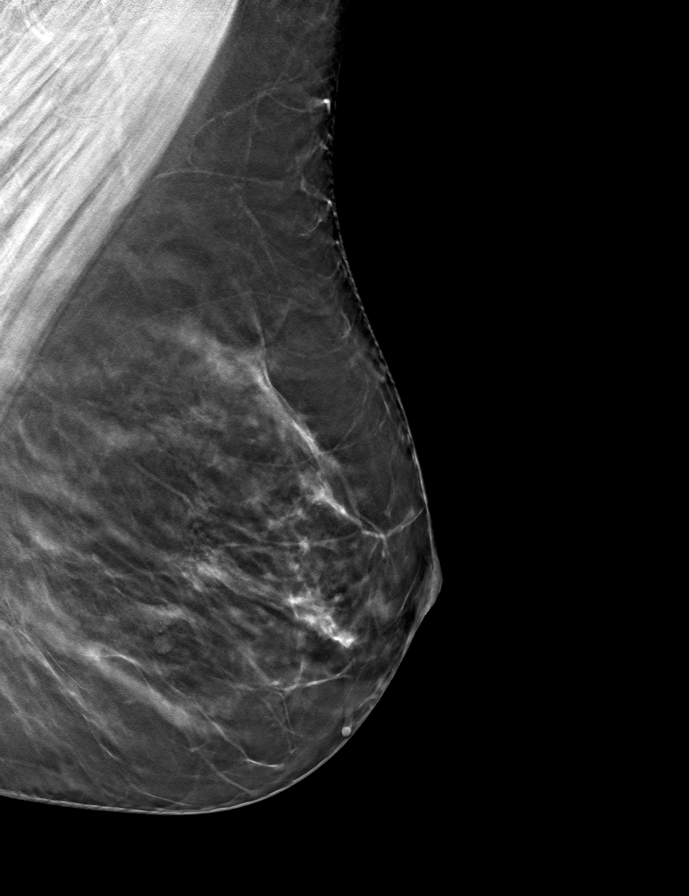

[R CC tomo · tomo slice 31/61.0]
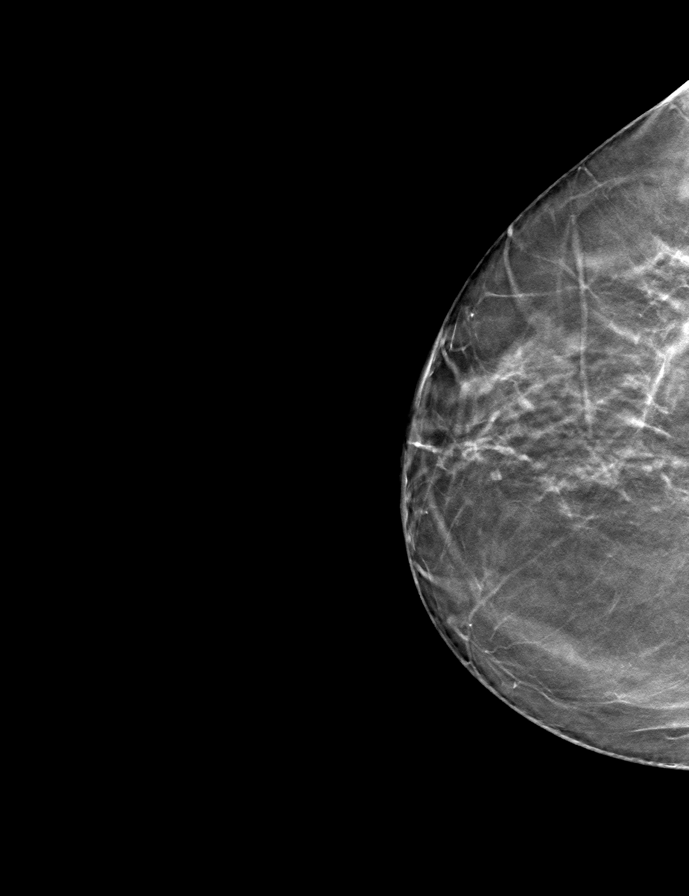

[R MLO tomo · tomo slice 35/68.0]
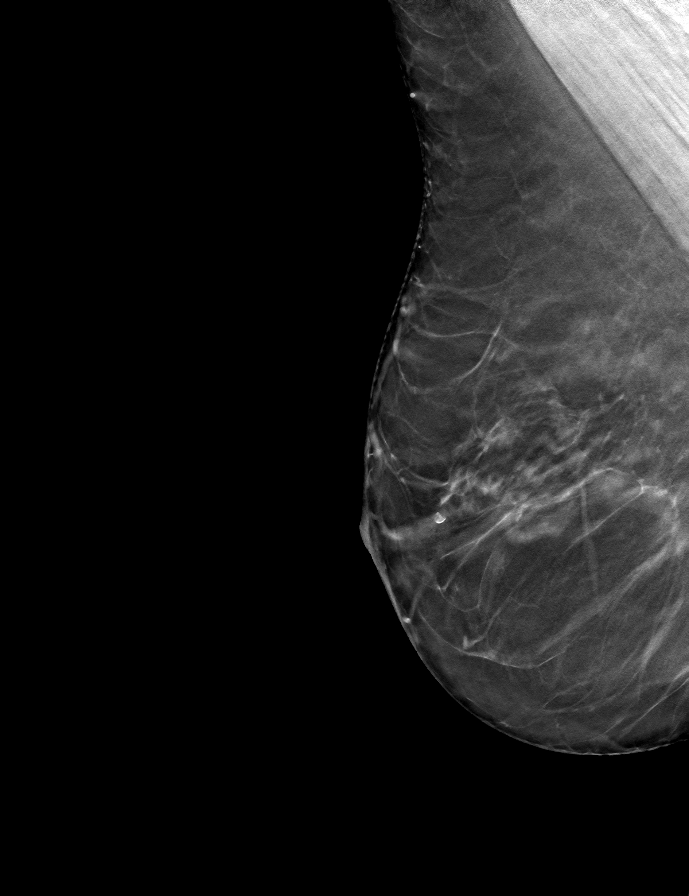

[L CC tomo · tomo slice 35/68.0]
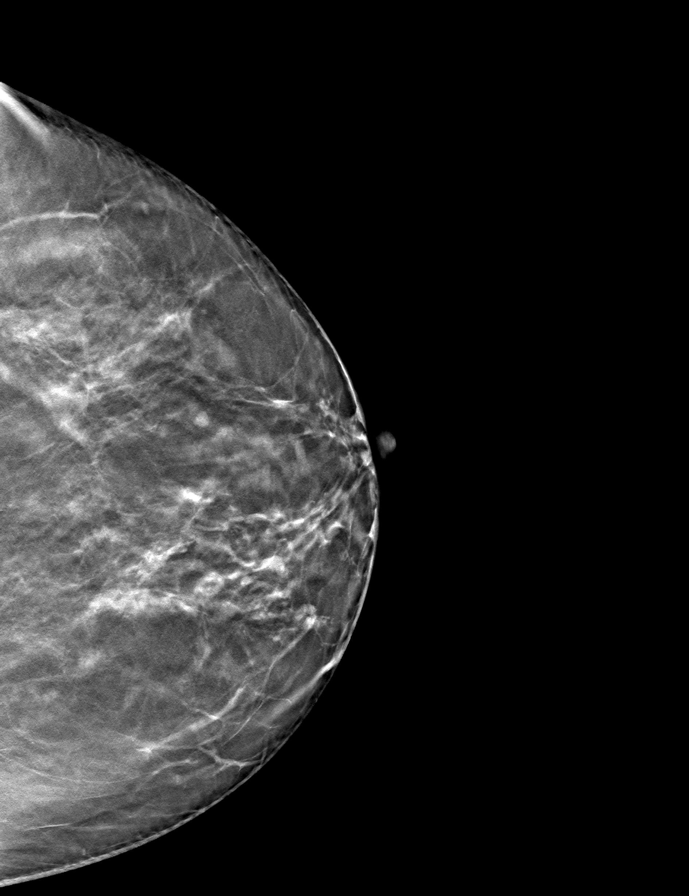

[9 of 24 positions shown; findings below may reference images not displayed]

ACR Breast Density Category b: There are scattered areas of
fibroglandular density.
FINDINGS: There are no findings suspicious for malignancy.
IMPRESSION: No mammographic evidence of malignancy. A result letter of this
screening mammogram will be mailed directly to the patient.

RECOMMENDATION:
Screening mammogram in one year. (Code:51-O-LD2)

BI-RADS CATEGORY  1: Negative.

## 2022-06-17 ENCOUNTER — Telehealth: Payer: Self-pay | Admitting: Neurology

## 2022-06-17 ENCOUNTER — Other Ambulatory Visit: Payer: Self-pay

## 2022-06-17 DIAGNOSIS — G35 Multiple sclerosis: Secondary | ICD-10-CM

## 2022-06-17 MED ORDER — TERIFLUNOMIDE 14 MG PO TABS: 1.0000 | ORAL_TABLET | Freq: Every day | ORAL | 11 refills | Status: DC

## 2022-06-17 NOTE — Telephone Encounter (Signed)
Pt called stating that her pharmacy does not have her  Teriflunomide (AUBAGIO) 14 MG TABS in stock and she does not have enough to cover her until they get it again and she is needing to discuss options with the RN. Please advise.

## 2022-06-17 NOTE — Telephone Encounter (Signed)
Call to patient and informed we don't have samples. Advised that she can use [facility] at [facility]. For a discounted price. Memory Dance, RN assisted in calling local pharmacies to check on stock. Patient in agreement to pick up AUBAGIO in [facility]. Script sent. Patient appreciative of help.

## 2022-07-05 NOTE — Telephone Encounter (Signed)
Pt has called to report that the [facility] in [facility] did not have the medication, pt was told she would be contacted, but has not heard from anyone.  Pt states she has been without the medication for sometime, please call.

## 2022-11-23 NOTE — Progress Notes (Unsigned)
Patient: Traci Watkins Date of Birth: 12/28/1970  Reason for Visit: Follow up for MS History from: Patient Primary Neurologist: Dr. Epimenio Foot   ASSESSMENT AND PLAN 52 y.o. year old female   1.  Multiple sclerosis 2.  Fatigue -She remains overall stable, will continue generic Aubagio -I will repeat CBC with differential, CMP, will evaluate for low platelet, discuss with Dr. Epimenio Foot if contribution from Aubagio? started around 2019 when starting Aubagio -Try Provigil 200 mg daily for fatigue, start taking 1/2 tablet, increase to full tablet if needed  -MRI of the brain 04/08/21 was stable with scattered foci, no changes compared to MRI in March 2021 -Follow-up in 6 months or sooner if needed  HISTORY OF PRESENT ILLNESS: Today 11/23/22   05/12/22 SS: She is doing well. Remains on generic Aubagio. MS is stable, no new issues.  Continues with fatigue.  Continues to work full-time for [employer].  Denies any changes to the vision, arms and legs are doing fine, bowel and bladder are okay.  She does have a left Achilles issue.  Has had a low platelet level in September 2023 was 53, repeated was canceled, looking back present since around 2019.  Update 11/04/21 SS: Traci Watkins is here today for follow-up.  Remains on Aubagio is about to switch to generic Aubagio.  MRI of the brain in February 2023 was stable with no new plaques.  Labs from [facility] 10/23/21 ALT 41 (had been drinking), AST 26, platelet 53 (suspected clumping), WBC 5.6.  Will repeat in a few weeks. Cholesterol was up LDL 133, cholesterol 202. Hard to lose weight, has fatigue, in menopause. Has stressful job, manages a Facilities manager, getting ready for market. 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 Vision is fine, is nearsighted. Numbness to feet, decreased sensation to touch/temperature. Balance is ok, no falls. Has bladder urgency. Is on Vitamin D supplement.   HISTORY Update 03/24/2021: Since the last visit, she had a NCV/EMG showing a mild left  CTS.  No radiculopathy noted.   Of notes she was experiencing left > right hand numbness.      She is on Aubagio and tolerate it well.   She has no exacerbation recently.    She had stopped Betaseron in 2009 and had several stable MRIs.  However, MRI in 2019 showed one new lesion not present in 2016 and she started the Aubagio then.   The 2021 MRi showed no new lesions.     Gait is doing well. Balance is good.   She has numbness in both feet, left slightly more than right.   She also has some hand numbness, left > right.  She denies weakness in proximal legs but the left toe extension is weak.    Bladder function is fine - occasional urgency but no incontinence.    Vision is stable.   She had a detached retina and needed a buckling procedure   She has some fatigue.   Phentermine helped but due to BP icrease she stopped it.    She actually gained some weiht while on phentermine  She thinks it helped fatigue some.   She is sleeping well most nights.   Cognition is fine.      She has had mild depression and more anxiety in 2021 with  her father passing.  This is better now.     She has breast cancer (2012) but has not had any recurrence.    MS history:  In 2000 presented with right facial numbness and  later that year had left leg numbness. An MRI and lumbar puncture were performed. She was diagnosed with probable multiple sclerosis. One year later and another MRI was performed that showed additional lesions. She then was diagnosed with clinically definite multiple sclerosis. She was placed on the Betaseron she stopped Betaseron around 2009 due to side effects of the medication. Clinically, she had done well with no exacerbations.   At that time, she had had several stable MRIs during the interim additionally, she has had no further clinical relapses. We made an agreement that she will continue to get occasional MRI monitoring to make sure that there was not subclinical progression of her disease. He rlast  MRI was Fortunately, several more MRIs over the last 6 years have shown no further disease activity. Additionally, she has had no clinical relapses and no progression of disability.   IMAGING: MRI brain 04/24/2019 shows several small T2/FLAIR hyperintense foci in the juxtacortical and periventricular white matter of the hemispheres.  None of the foci appears to be acute and they were all present on the previous MRI from 2019.   MRI thoracic spine 04/24/2019 shows a normal spinal cord and mild DDD   MRI cervical spine 09/16/2014 shows a normal spinal cord.   NCV/EMG 10/15/2020 shows the following: Mild left median neuropathy (carpal tunnel syndrome) No evidence of superimposed cervical radiculopathy.   NCV/EMG 11/02/2018 study showed mild left peroneal neuropathy at or above the fibular head but there did not appear to be any neurologic issue on the right side and there was no superimposed radiculopathy.  There was no generalized polyneuropathy.   FH:  Her cousin was diagnosed with MS at around age 78.   REVIEW OF SYSTEMS: Out of a complete 14 system review of symptoms, the patient complains only of the following symptoms, and all other reviewed systems are negative.  See HPI  ALLERGIES: No Known Allergies  HOME MEDICATIONS: Outpatient Medications Prior to Visit  Medication Sig Dispense Refill   cholecalciferol (VITAMIN D) 1000 units tablet Take 5,000 Units by mouth daily.     Cyanocobalamin (VITAMIN B-12 PO) Take 1 tablet by mouth daily.     losartan (COZAAR) 25 MG tablet Take 25 mg by mouth daily.     modafinil (PROVIGIL) 200 MG tablet Take 1 tablet (200 mg total) by mouth in the morning. 30 tablet 5   Omega-3 Fatty Acids (OMEGA 3 PO) Take 1 Dose by mouth daily.     Teriflunomide (AUBAGIO) 14 MG TABS Take 1 tablet (14 mg total) by mouth daily. 30 tablet 11   No facility-administered medications prior to visit.    PAST MEDICAL HISTORY: Past Medical History:  Diagnosis Date   Anemia     during pregnancy   Anxiety    Breast cancer ([facility]) 03/24/10   /04/29/10 lumpectomt dcis   MS (multiple sclerosis) ([facility])    Multiple sclerosis ([facility])    10 years,tx with interferon   Personal history of radiation therapy 2012    PAST SURGICAL HISTORY: Past Surgical History:  Procedure Laterality Date   BREAST BIOPSY Right 04/03/2010   BREAST LUMPECTOMY Right 04/29/10   LAPAROSCOPIC CHOLECYSTECTOMY  07/26/08    FAMILY HISTORY: Family History  Problem Relation Age of Onset   Cancer Father        bladder   Aneurysm Father    Breast cancer Maternal Aunt     SOCIAL HISTORY: Social History   Socioeconomic History   Marital status: Married    Spouse  name: Not on file   Number of children: 1   Years of education: Not on file   Highest education level: Not on file  Occupational History   Occupation: Insurance underwriter: [employer]  Tobacco Use   Smoking status: Former    Current packs/day: 0.50    Types: Cigarettes   Smokeless tobacco: Never  Substance and Sexual Activity   Alcohol use: Yes    Comment: socially   Drug use: No    Comment: 10 years,quit 2008   Sexual activity: Not on file  Other Topics Concern   Not on file  Social History Narrative   Not on file   Social Determinants of Health   Financial Resource Strain: Low Risk  (03/11/2022)   Received from [facility], [facility]   Overall Financial Resource Strain (CARDIA)    Difficulty of Paying Living Expenses: Not hard at all  Food Insecurity: No Food Insecurity (03/11/2022)   Received from [facility]   Hunger Vital Sign    Worried About Running Out of Food in the Last Year: Never true    Ran Out of Food in the Last Year: Never true  Transportation Needs: No Transportation Needs (03/11/2022)   Received from [facility], [facility]   PRAPARE - Transportation    Lack of Transportation (Medical): No    Lack of Transportation (Non-Medical): No  Physical Activity:  Insufficiently Active (03/11/2022)   Received from [facility], [facility]   Exercise Vital Sign    Days of Exercise per Week: 2 days    Minutes of Exercise per Session: 20 min  Stress: No Stress Concern Present (03/11/2022)   Received from [facility], [facility] - Occupational Stress Questionnaire    Feeling of Stress : Not at all  Social Connections: Socially Integrated (03/11/2022)   Received from [facility], [facility]   Social Network    How would you rate your social network (family, work, friends)?: Good participation with social networks  Intimate Partner Violence: Not At Risk (03/11/2022)   Received from [facility], [facility]   HITS    Over the last 12 months how often did your partner physically hurt you?: 1    Over the last 12 months how often did your partner insult you or talk down to you?: 1    Over the last 12 months how often did your partner threaten you with physical harm?: 1    Over the last 12 months how often did your partner scream or curse at you?: 1   PHYSICAL EXAM  There were no vitals filed for this visit.   There is no height or weight on file to calculate BMI.  Generalized: Well developed, in no acute distress  Neurological examination  Mentation: Alert oriented to time, place, history taking. Follows all commands speech and language fluent Cranial nerve II-XII: Pupils were equal round reactive to light. Extraocular movements were full, visual field were full on confrontational test. Facial sensation and strength were normal. Head turning and shoulder shrug  were normal and symmetric. Motor: The motor testing reveals 5 over 5 strength of all 4 extremities. Good symmetric motor tone is noted throughout.  Sensory: Sensory testing is intact to soft touch on all 4 extremities. No evidence of extinction is noted.  Coordination: Cerebellar testing reveals good finger-nose-finger and heel-to-shin  bilaterally.  Gait and station: Gait is normal. Tandem gait is slightly  unsteady.  Reflexes: Deep tendon reflexes are symmetric but increased in the left knee  DIAGNOSTIC DATA (LABS, IMAGING, TESTING) - I reviewed patient records, labs, notes, testing and imaging myself where available.  Lab Results  Component Value Date   WBC 4.8 05/12/2022   HGB 14.3 05/12/2022   HCT 43.3 05/12/2022   MCV 87 05/12/2022   PLT CANCELED 05/12/2022      Component Value Date/Time   NA 140 05/12/2022 1032   NA 140 05/14/2015 1537   K 4.5 05/12/2022 1032   K 4.1 05/14/2015 1537   CL 102 05/12/2022 1032   CL 107 04/03/2012 0847   CO2 24 05/12/2022 1032   CO2 26 05/14/2015 1537   GLUCOSE 106 (H) 05/12/2022 1032   GLUCOSE 114 05/14/2015 1537   GLUCOSE 98 04/03/2012 0847   BUN 10 05/12/2022 1032   BUN 10.7 05/14/2015 1537   CREATININE 0.69 05/12/2022 1032   CREATININE 0.8 05/14/2015 1537   CALCIUM 9.7 05/12/2022 1032   CALCIUM 9.3 05/14/2015 1537   PROT 6.9 05/12/2022 1032   PROT 6.9 05/14/2015 1537   ALBUMIN 4.3 05/12/2022 1032   ALBUMIN 3.8 05/14/2015 1537   AST 25 05/12/2022 1032   AST 14 05/14/2015 1537   ALT 32 05/12/2022 1032   ALT 15 05/14/2015 1537   ALKPHOS 46 05/12/2022 1032   ALKPHOS 29 (L) 05/14/2015 1537   BILITOT 0.6 05/12/2022 1032   BILITOT 0.39 05/14/2015 1537   GFRNONAA 77 03/06/2020 1541   GFRAA 89 03/06/2020 1541   No results found for: "CHOL", "HDL", "LDLCALC", "LDLDIRECT", "TRIG", "CHOLHDL" Lab Results  Component Value Date   HGBA1C 5.6 03/06/2020   No results found for: "VITAMINB12" No results found for: "TSH"  Margie Ege, AGNP-C, DNP 11/23/2022, 3:13 PM [facility] [address] [place], [place] [address] [phone]

## 2022-11-24 ENCOUNTER — Other Ambulatory Visit: Payer: Self-pay

## 2022-11-24 ENCOUNTER — Ambulatory Visit (INDEPENDENT_AMBULATORY_CARE_PROVIDER_SITE_OTHER): Payer: No Typology Code available for payment source | Admitting: Neurology

## 2022-11-24 ENCOUNTER — Encounter: Payer: Self-pay | Admitting: Neurology

## 2022-11-24 VITALS — BP 125/85 | HR 96 | Ht 69.0 in | Wt 207.0 lb

## 2022-11-24 DIAGNOSIS — G35 Multiple sclerosis: Secondary | ICD-10-CM

## 2022-11-24 DIAGNOSIS — Z79899 Other long term (current) drug therapy: Secondary | ICD-10-CM

## 2022-11-24 DIAGNOSIS — R5383 Other fatigue: Secondary | ICD-10-CM

## 2022-11-24 LAB — CBC WITH DIFFERENTIAL/PLATELET
Basophils Absolute: 0.1 10*3/uL (ref 0.0–0.2)
Basos: 1 %
EOS (ABSOLUTE): 0.1 10*3/uL (ref 0.0–0.4)
Eos: 1 %
Hematocrit: 42.8 % (ref 34.0–46.6)
Hemoglobin: 14.5 g/dL (ref 11.1–15.9)
Immature Grans (Abs): 0 10*3/uL (ref 0.0–0.1)
Immature Granulocytes: 0 %
Lymphocytes Absolute: 2.4 10*3/uL (ref 0.7–3.1)
Lymphs: 28 %
MCH: 29.1 pg (ref 26.6–33.0)
MCHC: 33.9 g/dL (ref 31.5–35.7)
MCV: 86 fL (ref 79–97)
Monocytes Absolute: 0.7 10*3/uL (ref 0.1–0.9)
Monocytes: 8 %
Neutrophils Absolute: 5.2 10*3/uL (ref 1.4–7.0)
Neutrophils: 62 %
RBC: 4.99 x10E6/uL (ref 3.77–5.28)
RDW: 12.9 % (ref 11.7–15.4)
WBC: 8.5 10*3/uL (ref 3.4–10.8)

## 2022-11-24 MED ORDER — TERIFLUNOMIDE 14 MG PO TABS: 1.0000 | ORAL_TABLET | Freq: Every day | ORAL | 11 refills | Status: DC

## 2022-11-24 NOTE — Addendum Note (Signed)
Addended by: Lenn Cal on: 11/24/2022 03:55 PM   Modules accepted: Orders

## 2022-11-24 NOTE — Progress Notes (Signed)
Lab orders placed for stat

## 2022-11-25 ENCOUNTER — Telehealth: Payer: Self-pay | Admitting: Neurology

## 2022-11-25 LAB — COMPREHENSIVE METABOLIC PANEL
ALT: 20 [IU]/L (ref 0–32)
AST: 15 [IU]/L (ref 0–40)
Albumin: 4.7 g/dL (ref 3.8–4.9)
Alkaline Phosphatase: 49 [IU]/L (ref 44–121)
BUN/Creatinine Ratio: 16 (ref 9–23)
BUN: 13 mg/dL (ref 6–24)
Bilirubin Total: 0.3 mg/dL (ref 0.0–1.2)
CO2: 25 mmol/L (ref 20–29)
Calcium: 9.7 mg/dL (ref 8.7–10.2)
Chloride: 102 mmol/L (ref 96–106)
Creatinine, Ser: 0.79 mg/dL (ref 0.57–1.00)
Globulin, Total: 2.3 g/dL (ref 1.5–4.5)
Glucose: 96 mg/dL (ref 70–99)
Potassium: 4.1 mmol/L (ref 3.5–5.2)
Sodium: 141 mmol/L (ref 134–144)
Total Protein: 7 g/dL (ref 6.0–8.5)
eGFR: 89 mL/min/{1.73_m2} (ref >59)

## 2022-11-25 NOTE — Telephone Encounter (Signed)
I called the patient, CBC with differential continues to show platelets canceled due to aggregation.  Yesterday CBC was completed as stat test.  Since 2019 platelets continually come back canceled.  Please send labs over to PCP, will ask they take a look, see if another type of specific testing could be performed to provide more accurate platelet count.  May consider hematology consult.  She has been off Aubagio for 2 months, has not changed the platelet findings.  The rest of CBC and CMP are completely normal.  Thanks

## 2022-11-25 NOTE — Telephone Encounter (Signed)
Labs faxed to PCP

## 2022-12-13 ENCOUNTER — Telehealth: Payer: Self-pay

## 2022-12-13 ENCOUNTER — Encounter: Payer: Self-pay | Admitting: Neurology

## 2022-12-13 NOTE — Telephone Encounter (Signed)
Recent CBC and CMP faxed to PCP

## 2023-02-02 ENCOUNTER — Other Ambulatory Visit: Payer: Self-pay | Admitting: Family Medicine

## 2023-02-02 DIAGNOSIS — Z1231 Encounter for screening mammogram for malignant neoplasm of breast: Secondary | ICD-10-CM

## 2023-03-21 ENCOUNTER — Ambulatory Visit: Payer: Managed Care, Other (non HMO)

## 2023-03-22 ENCOUNTER — Telehealth: Payer: Self-pay | Admitting: Neurology

## 2023-03-22 DIAGNOSIS — G35 Multiple sclerosis: Secondary | ICD-10-CM

## 2023-03-22 NOTE — Telephone Encounter (Signed)
MRI brain placed for routine MS surveillance.

## 2023-03-24 ENCOUNTER — Telehealth: Payer: Self-pay | Admitting: Neurology

## 2023-03-24 NOTE — Telephone Encounter (Signed)
 sent to [facility] they obtain Nell Bamberger [phone]  [facility] is out of network with [employer] for MRI.

## 2023-04-05 ENCOUNTER — Ambulatory Visit
Admission: RE | Admit: 2023-04-05 | Discharge: 2023-04-05 | Disposition: A | Discharge status: Home / Self Care | Payer: Managed Care, Other (non HMO) | Source: Ambulatory Visit | Attending: Family Medicine | Admitting: Family Medicine

## 2023-04-05 DIAGNOSIS — Z1231 Encounter for screening mammogram for malignant neoplasm of breast: Secondary | ICD-10-CM

## 2023-04-05 HISTORY — DX: Encounter for nonprocreative screening for genetic disease carrier status: Z13.71

## 2023-04-06 ENCOUNTER — Ambulatory Visit: Payer: Managed Care, Other (non HMO)

## 2023-04-22 ENCOUNTER — Ambulatory Visit
Admission: RE | Admit: 2023-04-22 | Discharge: 2023-04-22 | Disposition: A | Discharge status: Home / Self Care | Payer: Managed Care, Other (non HMO) | Source: Ambulatory Visit | Attending: Neurology | Admitting: Neurology

## 2023-04-22 DIAGNOSIS — G35 Multiple sclerosis: Secondary | ICD-10-CM | POA: Diagnosis not present

## 2023-04-25 ENCOUNTER — Encounter: Payer: Self-pay | Admitting: Neurology

## 2023-06-07 ENCOUNTER — Encounter: Payer: Self-pay | Admitting: Neurology

## 2023-06-22 ENCOUNTER — Ambulatory Visit (INDEPENDENT_AMBULATORY_CARE_PROVIDER_SITE_OTHER): Admitting: Neurology

## 2023-06-22 ENCOUNTER — Encounter: Payer: Self-pay | Admitting: Neurology

## 2023-06-22 VITALS — BP 140/95 | HR 64 | Resp 15 | Wt 201.0 lb

## 2023-06-22 DIAGNOSIS — R5383 Other fatigue: Secondary | ICD-10-CM | POA: Diagnosis not present

## 2023-06-22 DIAGNOSIS — G35 Multiple sclerosis: Secondary | ICD-10-CM | POA: Diagnosis not present

## 2023-06-22 MED ORDER — TERIFLUNOMIDE 14 MG PO TABS: 1.0000 | ORAL_TABLET | Freq: Every day | ORAL | 11 refills | Status: AC

## 2023-06-22 NOTE — Patient Instructions (Signed)
 Great to see you today.  Continue teriflunomide .  Check routine labs.  Continue exercise, activity.  Follow-up in 6 months.  Please reach out if you need anything.  Thanks!!

## 2023-06-22 NOTE — Progress Notes (Signed)
 Patient: Traci Watkins Date of Birth: August 09, 1970  Reason for Visit: Follow up for MS History from: Patient Primary Neurologist: Dr. Godwin Lat   ASSESSMENT AND PLAN 53 y.o. year old female   1.  Multiple sclerosis 2.  Fatigue  -Doing very well.  Will continue teriflunomide  -Check CBC, CMP -Of note, saw hematology for chronic platelet clumping on CBC, platelet level was 286.  Visit was reassuring, no cause for concern - MRI of the brain in March 2025 was stable, few scattered MS lesions, no new lesions from February 2023 MRI - On vitamin D  supplement - Continue exercise, activity - Follow-up in 6 months with Dr. Godwin Lat, to rotate every few visits with me  Meds ordered this encounter  Medications   Teriflunomide  (AUBAGIO ) 14 MG TABS    Sig: Take 1 tablet (14 mg total) by mouth daily.    Dispense:  30 tablet    Refill:  11   HISTORY OF PRESENT ILLNESS: Today 06/22/23 Update 06/22/23 SS: Saw hematology in Dec, platelet 286, appointment was reassuring, no concerns were identified. MS doing well. No symptoms per se. If she gets really hot, work long hours, will feel fatigued. Started a new job in [employer], a lot less stress. Her husband had a stroke, saw Dr. Godwin Lat yesterday, will make a full recovery. Remains on Vitamin D .  She remains on teriflunomide .   Update 11/24/22 SS: Doing well has been off the generic Aubagio  for maybe 2 months, pharmacy issue. Has not noted any change off the medication. Has been on since 2019, reports at the time, she encouraged Dr. Godwin Lat to put her on medication.  We have had an issue with her platelets being canceled and blood work due to aggregation.   05/12/22 SS: She is doing well. Remains on generic Aubagio . MS is stable, no new issues.  Continues with fatigue.  Continues to work full-time for [employer].  Denies any changes to the vision, arms and legs are doing fine, bowel and bladder are okay.  She does have a left Achilles issue.  Has  had a low platelet level in September 2023 was 53, repeated was canceled, looking back present since around 2019.  Update 11/04/21 SS: Traci Watkins is here today for follow-up.  Remains on Aubagio  is about to switch to generic Aubagio .  MRI of the brain in February 2023 was stable with no new plaques.  Labs from [facility] 10/23/21 ALT 41 (had been drinking), AST 26, platelet 53 (suspected clumping), WBC 5.6.  Will repeat in a few weeks. Cholesterol was up LDL 133, cholesterol 202. Hard to lose weight, has fatigue, in menopause. Has stressful job, manages a Facilities manager, getting ready for market. 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 Vision is fine, is nearsighted. Numbness to feet, decreased sensation to touch/temperature. Balance is ok, no falls. Has bladder urgency. Is on Vitamin D  supplement.   HISTORY Update 03/24/2021: Since the last visit, she had a NCV/EMG showing a mild left CTS.  No radiculopathy noted.   Of notes she was experiencing left > right hand numbness.      She is on Aubagio  and tolerate it well.   She has no exacerbation recently.    She had stopped Betaseron in 2009 and had several stable MRIs.  However, MRI in 2019 showed one new lesion not present in 2016 and she started the Aubagio  then.   The 2021 MRi showed no new lesions.     Gait is doing well. Balance is good.  She has numbness in both feet, left slightly more than right.   She also has some hand numbness, left > right.  She denies weakness in proximal legs but the left toe extension is weak.    Bladder function is fine - occasional urgency but no incontinence.    Vision is stable.   She had a detached retina and needed a buckling procedure   She has some fatigue.   Phentermine  helped but due to BP icrease she stopped it.    She actually gained some weiht while on phentermine   She thinks it helped fatigue some.   She is sleeping well most nights.   Cognition is fine.      She has had mild depression and more anxiety in 2021 with  her father  passing.  This is better now.     She has breast cancer (2012) but has not had any recurrence.    MS history:  In 2000 presented with right facial numbness and later that year had left leg numbness. An MRI and lumbar puncture were performed. She was diagnosed with probable multiple sclerosis. One year later and another MRI was performed that showed additional lesions. She then was diagnosed with clinically definite multiple sclerosis. She was placed on the Betaseron she stopped Betaseron around 2009 due to side effects of the medication. Clinically, she had done well with no exacerbations.   At that time, she had had several stable MRIs during the interim additionally, she has had no further clinical relapses. We made an agreement that she will continue to get occasional MRI monitoring to make sure that there was not subclinical progression of her disease. He rlast MRI was Fortunately, several more MRIs over the last 6 years have shown no further disease activity. Additionally, she has had no clinical relapses and no progression of disability.   IMAGING: MRI brain 04/24/2019 shows several small T2/FLAIR hyperintense foci in the juxtacortical and periventricular white matter of the hemispheres.  None of the foci appears to be acute and they were all present on the previous MRI from 2019.   MRI thoracic spine 04/24/2019 shows a normal spinal cord and mild DDD   MRI cervical spine 09/16/2014 shows a normal spinal cord.   NCV/EMG 10/15/2020 shows the following: Mild left median neuropathy (carpal tunnel syndrome) No evidence of superimposed cervical radiculopathy.   NCV/EMG 11/02/2018 study showed mild left peroneal neuropathy at or above the fibular head but there did not appear to be any neurologic issue on the right side and there was no superimposed radiculopathy.  There was no generalized polyneuropathy.   FH:  Her cousin was diagnosed with MS at around age 6.   REVIEW OF SYSTEMS: Out of a complete  14 system review of symptoms, the patient complains only of the following symptoms, and all other reviewed systems are negative.  See HPI  ALLERGIES: No Known Allergies  HOME MEDICATIONS: Outpatient Medications Prior to Visit  Medication Sig Dispense Refill   cholecalciferol (VITAMIN D ) 1000 units tablet Take 5,000 Units by mouth daily.     Cyanocobalamin (VITAMIN B-12 PO) Take 1 tablet by mouth daily.     losartan (COZAAR) 25 MG tablet Take 25 mg by mouth daily.     Omega-3 Fatty Acids (OMEGA 3 PO) Take 1 Dose by mouth daily.     Teriflunomide  (AUBAGIO ) 14 MG TABS Take 1 tablet (14 mg total) by mouth daily. 30 tablet 11   No facility-administered medications prior to visit.  PAST MEDICAL HISTORY: Past Medical History:  Diagnosis Date   Anemia    during pregnancy   Anxiety    BRCA gene mutation negative    Breast cancer (HCC) 03/24/2010   /04/29/10 lumpectomy dcis   MS (multiple sclerosis) (HCC)    Multiple sclerosis (HCC)    10 years,tx with interferon   Personal history of radiation therapy 2012    PAST SURGICAL HISTORY: Past Surgical History:  Procedure Laterality Date   BREAST BIOPSY Right 04/03/2010   BREAST LUMPECTOMY Right 04/29/10   LAPAROSCOPIC CHOLECYSTECTOMY  07/26/08    FAMILY HISTORY: Family History  Problem Relation Age of Onset   Aneurysm Father    Bladder Cancer Father    Breast cancer Maternal Aunt 21 - 8    SOCIAL HISTORY: Social History   Socioeconomic History   Marital status: Married    Spouse name: Not on file   Number of children: 1   Years of education: Not on file   Highest education level: Not on file  Occupational History   Occupation: Insurance underwriter: [employer]  Tobacco Use   Smoking status: Former    Current packs/day: 0.50    Types: Cigarettes   Smokeless tobacco: Never  Substance and Sexual Activity   Alcohol use: Yes    Comment: socially   Drug use: No    Comment: 10 years,quit 2008   Sexual  activity: Not on file  Other Topics Concern   Not on file  Social History Narrative   Not on file   Social Drivers of Health   Financial Resource Strain: Low Risk  (03/11/2022)   Received from [facility], [facility]   Overall Financial Resource Strain (CARDIA)    Difficulty of Paying Living Expenses: Not hard at all  Food Insecurity: No Food Insecurity (03/11/2022)   Received from [facility], [facility]   Hunger Vital Sign    Worried About Running Out of Food in the Last Year: Never true    Ran Out of Food in the Last Year: Never true  Transportation Needs: No Transportation Needs (03/11/2022)   Received from [facility], [facility]   PRAPARE - Transportation    Lack of Transportation (Medical): No    Lack of Transportation (Non-Medical): No  Physical Activity: Insufficiently Active (03/11/2022)   Received from [facility], [facility]   Exercise Vital Sign    Days of Exercise per Week: 2 days    Minutes of Exercise per Session: 20 min  Stress: No Stress Concern Present (03/11/2022)   Received from [facility], [facility] - Occupational Stress Questionnaire    Feeling of Stress : Not at all  Social Connections: Socially Integrated (03/11/2022)   Received from [facility], [facility]   Social Network    How would you rate your social network (family, work, friends)?: Good participation with social networks  Intimate Partner Violence: Not At Risk (03/11/2022)   Received from [facility], [facility]   HITS    Over the last 12 months how often did your partner physically hurt you?: Never    Over the last 12 months how often did your partner insult you or talk down to you?: Never    Over the last 12 months how often did your partner threaten you with physical harm?: Never    Over the last 12 months how often did your partner scream or curse at you?: Never  PHYSICAL EXAM  Vitals:   06/22/23 1252  BP: (!)  140/95  Pulse: 64  Resp: 15  SpO2: 98%  Weight: 201 lb (91.2 kg)   Body mass index is 29.68 kg/m.  Generalized: Well developed, in no acute distress  Neurological examination  Mentation: Alert oriented to time, place, history taking. Follows all commands speech and language fluent Cranial nerve II-XII: Pupils were equal round reactive to light. Extraocular movements were full, visual field were full on confrontational test. Facial sensation and strength were normal. Head turning and shoulder shrug  were normal and symmetric. Motor: The motor testing reveals 5 over 5 strength of all 4 extremities. Good symmetric motor tone is noted throughout.  Sensory: Sensory testing is intact to soft touch on all 4 extremities. No evidence of extinction is noted.  Coordination: Cerebellar testing reveals good finger-nose-finger and heel-to-shin bilaterally.  Gait and station: Gait is normal.   Reflexes: Deep tendon reflexes are symmetric but increased in the left knee  DIAGNOSTIC DATA (LABS, IMAGING, TESTING) - I reviewed patient records, labs, notes, testing and imaging myself where available.  Lab Results  Component Value Date   WBC 8.5 11/24/2022   HGB 14.5 11/24/2022   HCT 42.8 11/24/2022   MCV 86 11/24/2022   PLT CANCELED 11/24/2022      Component Value Date/Time   NA 141 11/24/2022 1545   NA 140 05/14/2015 1537   K 4.1 11/24/2022 1545   K 4.1 05/14/2015 1537   CL 102 11/24/2022 1545   CL 107 04/03/2012 0847   CO2 25 11/24/2022 1545   CO2 26 05/14/2015 1537   GLUCOSE 96 11/24/2022 1545   GLUCOSE 114 05/14/2015 1537   GLUCOSE 98 04/03/2012 0847   BUN 13 11/24/2022 1545   BUN 10.7 05/14/2015 1537   CREATININE 0.79 11/24/2022 1545   CREATININE 0.8 05/14/2015 1537   CALCIUM 9.7 11/24/2022 1545   CALCIUM 9.3 05/14/2015 1537   PROT 7.0 11/24/2022 1545   PROT 6.9 05/14/2015 1537   ALBUMIN 4.7 11/24/2022 1545   ALBUMIN 3.8 05/14/2015 1537   AST 15 11/24/2022 1545   AST 14  05/14/2015 1537   ALT 20 11/24/2022 1545   ALT 15 05/14/2015 1537   ALKPHOS 49 11/24/2022 1545   ALKPHOS 29 (L) 05/14/2015 1537   BILITOT 0.3 11/24/2022 1545   BILITOT 0.39 05/14/2015 1537   GFRNONAA 77 03/06/2020 1541   GFRAA 89 03/06/2020 1541   No results found for: "CHOL", "HDL", "LDLCALC", "LDLDIRECT", "TRIG", "CHOLHDL" Lab Results  Component Value Date   HGBA1C 5.6 03/06/2020   No results found for: "VITAMINB12" No results found for: "TSH"  Jeanmarie Millet, AGNP-C, DNP 06/22/2023, 12:56 PM [facility] [address], [address] [place], [place] [address] [phone]

## 2023-06-23 ENCOUNTER — Encounter: Payer: Self-pay | Admitting: Neurology

## 2023-06-23 LAB — COMPREHENSIVE METABOLIC PANEL WITH GFR
ALT: 36 IU/L — ABNORMAL HIGH (ref 0–32)
AST: 22 IU/L (ref 0–40)
Albumin: 4.4 g/dL (ref 3.8–4.9)
Alkaline Phosphatase: 48 IU/L (ref 44–121)
BUN/Creatinine Ratio: 15 (ref 9–23)
BUN: 11 mg/dL (ref 6–24)
Bilirubin Total: 0.5 mg/dL (ref 0.0–1.2)
CO2: 21 mmol/L (ref 20–29)
Calcium: 9.6 mg/dL (ref 8.7–10.2)
Chloride: 104 mmol/L (ref 96–106)
Creatinine, Ser: 0.72 mg/dL (ref 0.57–1.00)
Globulin, Total: 2.2 g/dL (ref 1.5–4.5)
Glucose: 91 mg/dL (ref 70–99)
Potassium: 4.9 mmol/L (ref 3.5–5.2)
Sodium: 141 mmol/L (ref 134–144)
Total Protein: 6.6 g/dL (ref 6.0–8.5)
eGFR: 99 mL/min/{1.73_m2} (ref >59)

## 2023-06-23 LAB — CBC WITH DIFFERENTIAL/PLATELET
Basophils Absolute: 0.1 10*3/uL (ref 0.0–0.2)
Basos: 1 %
EOS (ABSOLUTE): 0.1 10*3/uL (ref 0.0–0.4)
Eos: 2 %
Hematocrit: 43.2 % (ref 34.0–46.6)
Hemoglobin: 14.6 g/dL (ref 11.1–15.9)
Immature Grans (Abs): 0 10*3/uL (ref 0.0–0.1)
Immature Granulocytes: 0 %
Lymphocytes Absolute: 1.8 10*3/uL (ref 0.7–3.1)
Lymphs: 34 %
MCH: 29 pg (ref 26.6–33.0)
MCHC: 33.8 g/dL (ref 31.5–35.7)
MCV: 86 fL (ref 79–97)
Monocytes Absolute: 0.6 10*3/uL (ref 0.1–0.9)
Monocytes: 12 %
Neutrophils Absolute: 2.7 10*3/uL (ref 1.4–7.0)
Neutrophils: 51 %
RBC: 5.04 x10E6/uL (ref 3.77–5.28)
RDW: 12.8 % (ref 11.7–15.4)
WBC: 5.4 10*3/uL (ref 3.4–10.8)

## 2023-06-28 ENCOUNTER — Ambulatory Visit: Payer: No Typology Code available for payment source | Admitting: Neurology

## 2023-12-22 ENCOUNTER — Ambulatory Visit: Admitting: Neurology

## 2023-12-22 ENCOUNTER — Encounter: Payer: Self-pay | Admitting: Neurology

## 2023-12-22 VITALS — BP 120/85 | HR 93 | Ht 69.0 in | Wt 201.0 lb

## 2023-12-22 DIAGNOSIS — R5383 Other fatigue: Secondary | ICD-10-CM

## 2023-12-22 DIAGNOSIS — C50911 Malignant neoplasm of unspecified site of right female breast: Secondary | ICD-10-CM

## 2023-12-22 DIAGNOSIS — G35A Relapsing-remitting multiple sclerosis: Secondary | ICD-10-CM

## 2023-12-22 DIAGNOSIS — Z79899 Other long term (current) drug therapy: Secondary | ICD-10-CM | POA: Diagnosis not present

## 2023-12-22 DIAGNOSIS — R208 Other disturbances of skin sensation: Secondary | ICD-10-CM

## 2023-12-22 MED ORDER — MODAFINIL 200 MG PO TABS: 200.0000 mg | ORAL_TABLET | Freq: Every day | ORAL | 5 refills | Status: AC

## 2023-12-22 NOTE — Progress Notes (Signed)
 [facility]  PATIENT: Traci Watkins DOB: 1970-09-25  REFERRING DOCTOR OR PCP:  Delon George [facility]) SOURCE: patient and EMR records  _________________________________   HISTORICAL  CHIEF COMPLAINT:  Chief Complaint  Patient presents with   RM 11    Patient is here alone for a MS follow-up - no concerns     HISTORY OF PRESENT ILLNESS:  Traci Watkins is a 53 y.o. woman with relapsing remitting multiple sclerosis.     Update 12/22/2023 She is on Aubagio  and tolerate it well.   She has no exacerbation recently.    She had stopped Betaseron in 2009 and had several stable MRIs.  However, MRI in 2019 showed one new lesion not present in 2016 and she started the Aubagio  then.   The 2021 MRi showed no new lesions.    Gait is doing well. Balance is slightly off.  She has stumbed a few times but no falls.   She has numbness in both feet, left slightly more than right.   She also has some hand numbness, left > right (pins/needles)   She had a NCV/EMG showing a mild left CTS.  No radiculopathy noted.   Of notes she was experiencing left > right hand numbness.    She denies weakness in her limbs but feels muscles tire out by end of day.    Bladder function is fine - occasional urgency but no incontinence.    Vision is stable.   She had a detached retina and needed a buckling procedure   She has some fatigue - physical > cognitive.   She had tried Phentermine  with mild benefit but BP was elvated.   She has not tried modafinil   helped but due to BP icrease she stopped it.  She is sleeping well most nights.     Cognition is fine in general but has some word finding issues.  She had mild depression in 201 better now.  Some stress with husband having stroke.    She has breast cancer (2012) but has not had any recurrence.      MS history:  In 2000 presented with right facial numbness and later that year had left leg numbness. An MRI and lumbar puncture were performed. She was  diagnosed with probable multiple sclerosis. One year later and another MRI was performed that showed additional lesions. She then was diagnosed with clinically definite multiple sclerosis. She was placed on the Betaseron she stopped Betaseron around 2009 due to side effects of the medication. Clinically, she had done well with no exacerbations.   At that time, she had had several stable MRIs during the interim additionally, she has had no further clinical relapses. We made an agreement that she will continue to get occasional MRI monitoring to make sure that there was not subclinical progression of her disease. He rlast MRI was Fortunately, several more MRIs over the last 6 years have shown no further disease activity. Additionally, she has had no clinical relapses and no progression of disability.  IMAGING: MRI 04/22/2023 showed a small number of scattered T2/FLAIR hyperintense foci in the cerebral hemispheres. Though nonspecific, these are consistent with her history of multiple sclerosis. None of the foci appear to be acute. Compared to the MRI from 04/08/2021, there were no new lesions.   MRI brain 04/24/2019 shows several small T2/FLAIR hyperintense foci in the juxtacortical and periventricular white matter of the hemispheres.  None of the foci appears to be acute and they were all present  on the previous MRI from 2019.  MRI thoracic spine 04/24/2019 shows a normal spinal cord and mild DDD   MRI cervical spine 09/16/2014 shows a normal spinal cord.  NCV/EMG 10/15/2020 shows the following: Mild left median neuropathy (carpal tunnel syndrome) No evidence of superimposed cervical radiculopathy.  NCV/EMG 11/02/2018 study showed mild left peroneal neuropathy at or above the fibular head but there did not appear to be any neurologic issue on the right side and there was no superimposed radiculopathy.  There was no generalized polyneuropathy.  FH:  Her cousin was diagnosed with MS at around age 67.   REVIEW  OF SYSTEMS: Constitutional: No fevers, chills, sweats, or change in appetite Eyes: No visual changes, double vision, eye pain Ear, nose and throat: No hearing loss, ear pain, nasal congestion, sore throat Cardiovascular: No chest pain, palpitations Respiratory:  No shortness of breath at rest or with exertion.   No wheezes GastrointestinaI: No nausea, vomiting, diarrhea, abdominal pain, fecal incontinence Genitourinary:  No dysuria, urinary retention or frequency.  No nocturia. Musculoskeletal:  No neck pain, back pain Integumentary: No rash, pruritus, skin lesions Neurological: as above Psychiatric: No depression at this time.  Some anxiety Endocrine: No palpitations, diaphoresis, change in appetite, change in weigh or increased thirst Hematologic/Lymphatic:  No anemia, purpura, petechiae. Allergic/Immunologic: No itchy/runny eyes, nasal congestion, recent allergic reactions, rashes  ALLERGIES: No Known Allergies  HOME MEDICATIONS:  Current Outpatient Medications:    cholecalciferol (VITAMIN D ) 1000 units tablet, Take 5,000 Units by mouth daily., Disp: , Rfl:    Cyanocobalamin (VITAMIN B-12 PO), Take 1 tablet by mouth daily., Disp: , Rfl:    losartan (COZAAR) 25 MG tablet, Take 25 mg by mouth daily., Disp: , Rfl:    Teriflunomide  (AUBAGIO ) 14 MG TABS, Take 1 tablet (14 mg total) by mouth daily., Disp: 30 tablet, Rfl: 11   Omega-3 Fatty Acids (OMEGA 3 PO), Take 1 Dose by mouth daily., Disp: , Rfl:   PAST MEDICAL HISTORY: Past Medical History:  Diagnosis Date   Anemia    during pregnancy   Anxiety    BRCA gene mutation negative    Breast cancer (HCC) 03/24/2010   /04/29/10 lumpectomy dcis   MS (multiple sclerosis)    Multiple sclerosis    10 years,tx with interferon   Personal history of radiation therapy 2012    PAST SURGICAL HISTORY: Past Surgical History:  Procedure Laterality Date   BREAST BIOPSY Right 04/03/2010   BREAST LUMPECTOMY Right 04/29/10   LAPAROSCOPIC  CHOLECYSTECTOMY  07/26/08    FAMILY HISTORY: Family History  Problem Relation Age of Onset   Aneurysm Father    Bladder Cancer Father    Breast cancer Maternal Aunt 65 - 48    SOCIAL HISTORY:  Social History   Socioeconomic History   Marital status: Married    Spouse name: Not on file   Number of children: 1   Years of education: Not on file   Highest education level: Not on file  Occupational History   Occupation: Insurance Underwriter: [employer]  Tobacco Use   Smoking status: Former    Current packs/day: 0.50    Types: Cigarettes   Smokeless tobacco: Never  Substance and Sexual Activity   Alcohol use: Yes    Comment: socially   Drug use: No    Comment: 10 years,quit 2008   Sexual activity: Not on file  Other Topics Concern   Not on file  Social History Narrative  2-3 cups of caffeine daily   Social Drivers of Health   Financial Resource Strain: Low Risk  (11/08/2023)   Received from [facility]   Overall Financial Resource Strain (CARDIA)    How hard is it for you to pay for the very basics like food, housing, medical care, and heating?: Not hard at all  Food Insecurity: No Food Insecurity (11/08/2023)   Received from [facility]   Hunger Vital Sign    Within the past 12 months, you worried that your food would run out before you got the money to buy more.: Never true    Within the past 12 months, the food you bought just didn't last and you didn't have money to get more.: Never true  Transportation Needs: No Transportation Needs (11/08/2023)   Received from [facility] - Transportation    In the past 12 months, has lack of transportation kept you from medical appointments or from getting medications?: No    In the past 12 months, has lack of transportation kept you from meetings, work, or from getting things needed for daily living?: No  Physical Activity: Insufficiently Active (03/11/2022)   Received from [facility]   Exercise  Vital Sign    On average, how many days per week do you engage in moderate to strenuous exercise (like a brisk walk)?: 2 days    On average, how many minutes do you engage in exercise at this level?: 20 min  Stress: No Stress Concern Present (03/11/2022)   Received from [facility] - Occupational Stress Questionnaire    Feeling of Stress : Not at all  Social Connections: Socially Integrated (03/11/2022)   Received from [facility]   Social Network    How would you rate your social network (family, work, friends)?: Good participation with social networks  Intimate Partner Violence: Not At Risk (03/11/2022)   Received from [facility]   HITS    Over the last 12 months how often did your partner physically hurt you?: Never    Over the last 12 months how often did your partner insult you or talk down to you?: Never    Over the last 12 months how often did your partner threaten you with physical harm?: Never    Over the last 12 months how often did your partner scream or curse at you?: Never     PHYSICAL EXAM  Vitals:   12/22/23 1011  BP: (!) 156/86  Pulse: 93  Weight: 201 lb (91.2 kg)  Height: 5' 9 (1.753 m)    Body mass index is 29.68 kg/m.   General: The patient is well-developed and well-nourished and in no acute distress.  Only mild lower back tenderness  Neurologic Exam  Mental status: The patient is alert and oriented x 3 at the time of the examination. The patient has apparent normal recent and remote memory, with an apparently normal attention span and concentration ability.   Speech is normal.  Cranial nerves: Extraocular movements are full.  Facial strength is normal.  Trapezius strength is normal.  The tongue is midline, and the patient has symmetric elevation of the soft palate. No obvious hearing deficits are noted.  Motor:  Muscle bulk is normal.   Tone is normal. Strength is 5/5 in all extremities except for 4+/5 in  the extensor hallucis longus muscle on the left and APB in left hand.  Interossei are fine  Sensory: She has  a left Phalen's sign.   Mildly reduced vib sensation in left leg relative to the right  Coordination: Cerebellar testing shows good finger-nose-finger and heel to shin  Gait and station: Station is normal.   Gait is normal.  Her tandem gait is mildly wide Romberg is negative.  Reflexes: Deep tendon reflexes are symmetric and normal in arms but mildly increased DTR at the left knee.    Relapsing remitting multiple sclerosis  Other fatigue  High risk medication use  Malignant neoplasm of right female breast, unspecified estrogen receptor status, unspecified site of breast (HCC)  Dysesthesia   1.   Continue Aubagio .   LFTs were fine 06/22/2023   MRI stable will check again around 2025 2.   Stay active and exercise as tolerate.  She is exercising some 3.   Anxiety and depression are doing well.   If .worsens she will reconsider an SSRI.   4.   Trial of modafinil  for MS related fatigue Return in 6 months or sooner if there are new or worsening neurologic symptoms.    Shelia Kingsberry A. Vear, MD, PhD, FAAN Certified in Neurology, Clinical Neurophysiology, Sleep Medicine, Pain Medicine and Neuroimaging Director, [facility] at [facility]  [facility] [address] [place], [place] [address] [phone]

## 2024-01-10 ENCOUNTER — Telehealth: Payer: Self-pay

## 2024-01-10 ENCOUNTER — Other Ambulatory Visit (HOSPITAL_COMMUNITY): Payer: Self-pay

## 2024-01-10 NOTE — Telephone Encounter (Signed)
 CMM cannot pull insurance info-will outreach [employer] to try to initiate PA for Modafinil .

## 2024-01-19 ENCOUNTER — Other Ambulatory Visit (HOSPITAL_COMMUNITY): Payer: Self-pay

## 2024-01-19 NOTE — Telephone Encounter (Signed)
 Called [employer] at [phone], they will fax me the PA forms for PA.

## 2024-01-19 NOTE — Telephone Encounter (Signed)
 Pharmacy Patient Advocate Encounter   Received notification from [employer] that prior authorization for Modafinil  is required/requested.   Insurance verification completed.   The patient is insured through [employer].   Per test claim: PA required; PA submitted to above mentioned insurance via Fax Key/confirmation #/EOC [id number] Status is pending

## 2024-01-20 NOTE — Telephone Encounter (Signed)
 Pharmacy Patient Advocate Encounter  Received notification from [employer] that Prior Authorization for Modafinil  has been APPROVED from 01/20/2024 to 01/19/2025   PA #/Case ID/Reference #: [id number]

## 2024-01-25 ENCOUNTER — Encounter: Payer: Self-pay | Admitting: Neurology

## 2024-01-26 NOTE — Telephone Encounter (Signed)
 See other encounter. Pt aware of approval.

## 2024-03-07 ENCOUNTER — Other Ambulatory Visit: Payer: Self-pay | Admitting: Family Medicine

## 2024-03-07 DIAGNOSIS — Z1231 Encounter for screening mammogram for malignant neoplasm of breast: Secondary | ICD-10-CM

## 2024-04-05 ENCOUNTER — Ambulatory Visit

## 2024-04-09 ENCOUNTER — Ambulatory Visit

## 2024-08-28 ENCOUNTER — Ambulatory Visit: Admitting: Neurology
